# Patient Record
Sex: Male | Born: 1990 | Race: Black or African American | Hispanic: No | Marital: Married | Smoking: Never smoker
Health system: Southern US, Community
[De-identification: ages and names within clinical notes are randomized; demographics above are authoritative.]

## PROBLEM LIST (undated history)

## (undated) DIAGNOSIS — J302 Other seasonal allergic rhinitis: Secondary | ICD-10-CM

## (undated) DIAGNOSIS — B019 Varicella without complication: Secondary | ICD-10-CM

## (undated) DIAGNOSIS — I10 Essential (primary) hypertension: Secondary | ICD-10-CM

## (undated) HISTORY — DX: Varicella without complication: B01.9

## (undated) HISTORY — DX: Other seasonal allergic rhinitis: J30.2

## (undated) HISTORY — DX: Essential (primary) hypertension: I10

---
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
## 2011-02-07 ENCOUNTER — Emergency Department (HOSPITAL_COMMUNITY): Payer: Self-pay

## 2011-02-07 ENCOUNTER — Emergency Department (HOSPITAL_COMMUNITY)
Admission: EM | Admit: 2011-02-07 | Discharge: 2011-02-07 | Disposition: A | Discharge status: Home / Self Care | Payer: Self-pay | Attending: Emergency Medicine | Admitting: Emergency Medicine

## 2011-02-07 DIAGNOSIS — R071 Chest pain on breathing: Secondary | ICD-10-CM | POA: Insufficient documentation

## 2011-02-07 DIAGNOSIS — W03XXXA Other fall on same level due to collision with another person, initial encounter: Secondary | ICD-10-CM | POA: Insufficient documentation

## 2011-02-07 DIAGNOSIS — IMO0002 Reserved for concepts with insufficient information to code with codable children: Secondary | ICD-10-CM | POA: Insufficient documentation

## 2012-11-13 ENCOUNTER — Emergency Department (HOSPITAL_COMMUNITY)
Admission: EM | Admit: 2012-11-13 | Discharge: 2012-11-13 | Disposition: A | Discharge status: Home / Self Care | Payer: Self-pay | Attending: Emergency Medicine | Admitting: Emergency Medicine

## 2012-11-13 ENCOUNTER — Encounter (HOSPITAL_COMMUNITY): Payer: Self-pay | Admitting: Nurse Practitioner

## 2012-11-13 ENCOUNTER — Emergency Department (HOSPITAL_COMMUNITY): Payer: Self-pay

## 2012-11-13 DIAGNOSIS — S62319A Displaced fracture of base of unspecified metacarpal bone, initial encounter for closed fracture: Secondary | ICD-10-CM | POA: Insufficient documentation

## 2012-11-13 DIAGNOSIS — IMO0002 Reserved for concepts with insufficient information to code with codable children: Secondary | ICD-10-CM | POA: Insufficient documentation

## 2012-11-13 MED ORDER — HYDROCODONE-ACETAMINOPHEN 5-325 MG PO TABS: 1.0000 | ORAL_TABLET | ORAL | Status: DC | PRN

## 2012-11-13 NOTE — Progress Notes (Signed)
Orthopedic Tech Progress Note Patient Details:  Dylan Herrera [birth year] [id number]  Ortho Devices Type of Ortho Device: Volar splint Ortho Device/Splint Location: right hand;dorsal and volar Ortho Device/Splint Interventions: Application   Lyberti Thrush 11/13/2012, 2:44 PM
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
## 2012-11-13 NOTE — ED Notes (Signed)
Ortho tech advised of pt need.
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
## 2012-11-13 NOTE — ED Provider Notes (Signed)
Medical screening examination/treatment/procedure(s) were performed by non-physician practitioner and as supervising physician I was immediately available for consultation/collaboration.    Rodd Heft R Cashae Weich, MD 11/13/12 1612 

## 2015-01-25 ENCOUNTER — Encounter: Payer: Self-pay | Admitting: Family Medicine

## 2015-01-25 ENCOUNTER — Ambulatory Visit (INDEPENDENT_AMBULATORY_CARE_PROVIDER_SITE_OTHER): Payer: BLUE CROSS/BLUE SHIELD | Admitting: Family Medicine

## 2015-01-25 VITALS — BP 120/80 | Temp 98.5°F | Ht 70.75 in | Wt 240.7 lb

## 2015-01-25 DIAGNOSIS — Z113 Encounter for screening for infections with a predominantly sexual mode of transmission: Secondary | ICD-10-CM | POA: Diagnosis not present

## 2015-01-25 DIAGNOSIS — Z23 Encounter for immunization: Secondary | ICD-10-CM | POA: Diagnosis not present

## 2015-01-25 DIAGNOSIS — Z6833 Body mass index (BMI) 33.0-33.9, adult: Secondary | ICD-10-CM

## 2015-01-25 DIAGNOSIS — Z7189 Other specified counseling: Secondary | ICD-10-CM | POA: Diagnosis not present

## 2015-01-25 DIAGNOSIS — Z0000 Encounter for general adult medical examination without abnormal findings: Secondary | ICD-10-CM | POA: Diagnosis not present

## 2015-01-25 DIAGNOSIS — Z7689 Persons encountering health services in other specified circumstances: Secondary | ICD-10-CM
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
## 2015-01-25 NOTE — Patient Instructions (Signed)
BEFORE YOU LEAVE: -Tdap -labs  -We have ordered labs or studies at this visit. It can take up to 1-2 weeks for results and processing. We will contact you with instructions IF your results are abnormal. Normal results will be released to your [facility]. If you have not heard from us or can not find your results in [facility] in 2 weeks please contact our office.  We recommend the following healthy lifestyle measures: - eat a healthy diet consisting of lots of vegetables, fruits, beans, nuts, seeds, healthy meats such as white chicken and fish and whole grains.  - avoid fried foods, fast food, processed foods, sodas, red meet and other fattening foods.  - get a least 150 minutes of aerobic exercise per week.

## 2015-01-25 NOTE — Addendum Note (Signed)
Addended by: Raj JanusADKINS, Glen Blatchley T on: 01/25/2015 05:22 PM   Modules accepted: Orders

## 2015-01-26 LAB — GC/CHLAMYDIA PROBE AMP, URINE
Chlamydia, Swab/Urine, PCR: POSITIVE — AB
GC Probe Amp, Urine: NEGATIVE

## 2015-01-26 LAB — LIPID PANEL
CHOL/HDL RATIO: 5
Cholesterol: 189 mg/dL (ref 0–200)
HDL: 35.2 mg/dL — ABNORMAL LOW (ref >39.00)
LDL Cholesterol: 126 mg/dL — ABNORMAL HIGH (ref 0–99)
NONHDL: 153.8
TRIGLYCERIDES: 141 mg/dL (ref 0.0–149.0)
VLDL: 28.2 mg/dL (ref 0.0–40.0)

## 2015-01-26 LAB — HEMOGLOBIN A1C: Hgb A1c MFr Bld: 5.6 % (ref 4.6–6.5)

## 2015-02-02 ENCOUNTER — Encounter: Payer: Self-pay | Admitting: *Deleted

## 2015-02-06 MED ORDER — AZITHROMYCIN 500 MG PO TABS: ORAL_TABLET | ORAL | Status: DC

## 2015-02-06 NOTE — Addendum Note (Signed)
Addended by: Johnella MoloneyFUNDERBURK, JO A on: 02/06/2015 02:38 PM   Modules accepted: Orders

## 2017-05-21 ENCOUNTER — Encounter: Payer: Self-pay | Admitting: Family Medicine

## 2017-09-10 ENCOUNTER — Encounter: Payer: Self-pay | Admitting: Family Medicine

## 2017-09-20 ENCOUNTER — Emergency Department (HOSPITAL_COMMUNITY): Payer: Medicaid Other | Admitting: Certified Registered Nurse Anesthetist

## 2017-09-20 ENCOUNTER — Encounter (HOSPITAL_COMMUNITY): Admission: EM | Disposition: A | Discharge status: Home / Self Care | Payer: Self-pay | Source: Home / Self Care

## 2017-09-20 ENCOUNTER — Emergency Department (HOSPITAL_COMMUNITY): Payer: Medicaid Other

## 2017-09-20 ENCOUNTER — Inpatient Hospital Stay (HOSPITAL_COMMUNITY)
Admission: EM | Admit: 2017-09-20 | Discharge: 2017-09-21 | DRG: 343 | Disposition: A | Discharge status: Home / Self Care | Payer: Medicaid Other | Attending: Surgery | Admitting: Surgery

## 2017-09-20 ENCOUNTER — Encounter (HOSPITAL_COMMUNITY): Payer: Self-pay

## 2017-09-20 ENCOUNTER — Other Ambulatory Visit: Payer: Self-pay

## 2017-09-20 DIAGNOSIS — K3580 Unspecified acute appendicitis: Secondary | ICD-10-CM | POA: Diagnosis present

## 2017-09-20 HISTORY — PX: LAPAROSCOPIC APPENDECTOMY: SHX408

## 2017-09-20 LAB — COMPREHENSIVE METABOLIC PANEL
ALBUMIN: 4.6 g/dL (ref 3.5–5.0)
ALK PHOS: 47 U/L (ref 38–126)
ALT: 24 U/L (ref 17–63)
ANION GAP: 11 (ref 5–15)
AST: 27 U/L (ref 15–41)
BILIRUBIN TOTAL: 1.6 mg/dL — AB (ref 0.3–1.2)
BUN: 15 mg/dL (ref 6–20)
CALCIUM: 9.5 mg/dL (ref 8.9–10.3)
CO2: 23 mmol/L (ref 22–32)
Chloride: 104 mmol/L (ref 101–111)
Creatinine, Ser: 0.97 mg/dL (ref 0.61–1.24)
GFR calc Af Amer: >60 mL/min (ref >60)
GFR calc non Af Amer: >60 mL/min (ref >60)
GLUCOSE: 84 mg/dL (ref 65–99)
POTASSIUM: 4.1 mmol/L (ref 3.5–5.1)
Sodium: 138 mmol/L (ref 135–145)
TOTAL PROTEIN: 7.7 g/dL (ref 6.5–8.1)

## 2017-09-20 LAB — LIPASE, BLOOD: Lipase: 21 U/L (ref 11–51)

## 2017-09-20 LAB — CBC
HEMATOCRIT: 43.1 % (ref 39.0–52.0)
HEMOGLOBIN: 14.5 g/dL (ref 13.0–17.0)
MCH: 27.2 pg (ref 26.0–34.0)
MCHC: 33.6 g/dL (ref 30.0–36.0)
MCV: 80.7 fL (ref 78.0–100.0)
Platelets: 256 10*3/uL (ref 150–400)
RBC: 5.34 MIL/uL (ref 4.22–5.81)
RDW: 13.8 % (ref 11.5–15.5)
WBC: 7.1 10*3/uL (ref 4.0–10.5)

## 2017-09-20 SURGERY — APPENDECTOMY, LAPAROSCOPIC
Anesthesia: General | Site: Abdomen

## 2017-09-20 MED ORDER — PROPOFOL 10 MG/ML IV BOLUS
INTRAVENOUS | Status: AC
Start: 2017-09-20 — End: 2017-09-20
  Filled 2017-09-20: qty 20

## 2017-09-20 MED ORDER — HEPARIN SODIUM (PORCINE) 5000 UNIT/ML IJ SOLN
5000.0000 [IU] | Freq: Three times a day (TID) | INTRAMUSCULAR | Status: DC
  Administered 2017-09-20 – 2017-09-21 (×3): 5000 [IU] via SUBCUTANEOUS
  Filled 2017-09-20 (×3): qty 1

## 2017-09-20 MED ORDER — MORPHINE SULFATE (PF) 4 MG/ML IV SOLN
4.0000 mg | Freq: Once | INTRAVENOUS | Status: DC
  Filled 2017-09-20: qty 1

## 2017-09-20 MED ORDER — MIDAZOLAM HCL 2 MG/2ML IJ SOLN
INTRAMUSCULAR | Status: DC | PRN
  Administered 2017-09-20: 2 mg via INTRAVENOUS

## 2017-09-20 MED ORDER — ONDANSETRON HCL 4 MG/2ML IJ SOLN: 4.0000 mg | Freq: Once | INTRAMUSCULAR | Status: DC | PRN

## 2017-09-20 MED ORDER — SUGAMMADEX SODIUM 200 MG/2ML IV SOLN
INTRAVENOUS | Status: DC | PRN
  Administered 2017-09-20: 200 mg via INTRAVENOUS

## 2017-09-20 MED ORDER — IOPAMIDOL (ISOVUE-300) INJECTION 61%
INTRAVENOUS | Status: AC
  Administered 2017-09-20: 100 mL
  Filled 2017-09-20: qty 100

## 2017-09-20 MED ORDER — BUPIVACAINE-EPINEPHRINE (PF) 0.5% -1:200000 IJ SOLN
INTRAMUSCULAR | Status: AC
  Filled 2017-09-20: qty 30

## 2017-09-20 MED ORDER — IBUPROFEN 600 MG PO TABS: 600.0000 mg | ORAL_TABLET | Freq: Four times a day (QID) | ORAL | Status: DC | PRN

## 2017-09-20 MED ORDER — LACTATED RINGERS IV SOLN
INTRAVENOUS | Status: DC | PRN
  Administered 2017-09-20: 17:00:00 via INTRAVENOUS

## 2017-09-20 MED ORDER — 0.9 % SODIUM CHLORIDE (POUR BTL) OPTIME
TOPICAL | Status: DC | PRN
  Administered 2017-09-20: 1000 mL

## 2017-09-20 MED ORDER — PROPOFOL 10 MG/ML IV BOLUS
INTRAVENOUS | Status: DC | PRN
  Administered 2017-09-20: 200 mg via INTRAVENOUS

## 2017-09-20 MED ORDER — BUPIVACAINE-EPINEPHRINE 0.5% -1:200000 IJ SOLN
INTRAMUSCULAR | Status: DC | PRN
  Administered 2017-09-20: 20 mL

## 2017-09-20 MED ORDER — SODIUM CHLORIDE 0.9 % IR SOLN
Status: DC | PRN
  Administered 2017-09-20: 1000 mL

## 2017-09-20 MED ORDER — OXYCODONE HCL 5 MG/5ML PO SOLN: 5.0000 mg | Freq: Once | ORAL | Status: DC | PRN

## 2017-09-20 MED ORDER — METRONIDAZOLE IN NACL 5-0.79 MG/ML-% IV SOLN
500.0000 mg | Freq: Once | INTRAVENOUS | Status: AC
  Administered 2017-09-20: 500 mg via INTRAVENOUS
  Filled 2017-09-20: qty 100

## 2017-09-20 MED ORDER — HYDROMORPHONE HCL 1 MG/ML IJ SOLN
0.5000 mg | INTRAMUSCULAR | Status: DC | PRN
  Administered 2017-09-20: 0.5 mg via INTRAVENOUS
  Filled 2017-09-20: qty 1

## 2017-09-20 MED ORDER — TRAMADOL HCL 50 MG PO TABS
50.0000 mg | ORAL_TABLET | Freq: Four times a day (QID) | ORAL | Status: DC | PRN
  Administered 2017-09-20 – 2017-09-21 (×2): 50 mg via ORAL
  Filled 2017-09-20 (×2): qty 1

## 2017-09-20 MED ORDER — HYDRALAZINE HCL 20 MG/ML IJ SOLN: 10.0000 mg | INTRAMUSCULAR | Status: DC | PRN

## 2017-09-20 MED ORDER — FENTANYL CITRATE (PF) 100 MCG/2ML IJ SOLN
25.0000 ug | INTRAMUSCULAR | Status: DC | PRN
  Administered 2017-09-20 (×2): 50 ug via INTRAVENOUS

## 2017-09-20 MED ORDER — OXYCODONE HCL 5 MG PO TABS: 5.0000 mg | ORAL_TABLET | Freq: Once | ORAL | Status: DC | PRN

## 2017-09-20 MED ORDER — ONDANSETRON 4 MG PO TBDP: 4.0000 mg | ORAL_TABLET | Freq: Four times a day (QID) | ORAL | Status: DC | PRN

## 2017-09-20 MED ORDER — FENTANYL CITRATE (PF) 250 MCG/5ML IJ SOLN
INTRAMUSCULAR | Status: DC | PRN
  Administered 2017-09-20: 50 ug via INTRAVENOUS
  Administered 2017-09-20: 100 ug via INTRAVENOUS
  Administered 2017-09-20 (×2): 50 ug via INTRAVENOUS

## 2017-09-20 MED ORDER — SIMETHICONE 80 MG PO CHEW: 40.0000 mg | CHEWABLE_TABLET | Freq: Four times a day (QID) | ORAL | Status: DC | PRN

## 2017-09-20 MED ORDER — SODIUM CHLORIDE 0.9 % IV BOLUS (SEPSIS)
1000.0000 mL | Freq: Once | INTRAVENOUS | Status: AC
  Administered 2017-09-20: 1000 mL via INTRAVENOUS

## 2017-09-20 MED ORDER — FENTANYL CITRATE (PF) 100 MCG/2ML IJ SOLN
INTRAMUSCULAR | Status: AC
  Filled 2017-09-20: qty 2

## 2017-09-20 MED ORDER — MIDAZOLAM HCL 2 MG/2ML IJ SOLN
INTRAMUSCULAR | Status: AC
  Filled 2017-09-20: qty 2

## 2017-09-20 MED ORDER — SODIUM CHLORIDE 0.9 % IJ SOLN
INTRAMUSCULAR | Status: DC | PRN
  Administered 2017-09-20: 10 mL

## 2017-09-20 MED ORDER — FENTANYL CITRATE (PF) 250 MCG/5ML IJ SOLN
INTRAMUSCULAR | Status: AC
  Filled 2017-09-20: qty 5

## 2017-09-20 MED ORDER — DIPHENHYDRAMINE HCL 25 MG PO CAPS: 25.0000 mg | ORAL_CAPSULE | Freq: Four times a day (QID) | ORAL | Status: DC | PRN

## 2017-09-20 MED ORDER — ROCURONIUM BROMIDE 10 MG/ML (PF) SYRINGE
PREFILLED_SYRINGE | INTRAVENOUS | Status: DC | PRN
  Administered 2017-09-20: 50 mg via INTRAVENOUS

## 2017-09-20 MED ORDER — ACETAMINOPHEN 500 MG PO TABS
1000.0000 mg | ORAL_TABLET | Freq: Four times a day (QID) | ORAL | Status: DC
  Administered 2017-09-20 – 2017-09-21 (×2): 1000 mg via ORAL
  Filled 2017-09-20 (×2): qty 2

## 2017-09-20 MED ORDER — ONDANSETRON HCL 4 MG/2ML IJ SOLN
4.0000 mg | Freq: Four times a day (QID) | INTRAMUSCULAR | Status: DC | PRN
  Administered 2017-09-20: 4 mg via INTRAVENOUS
  Filled 2017-09-20: qty 2

## 2017-09-20 MED ORDER — LACTATED RINGERS IV SOLN
INTRAVENOUS | Status: DC
  Administered 2017-09-20 – 2017-09-21 (×2): via INTRAVENOUS

## 2017-09-20 MED ORDER — ONDANSETRON HCL 4 MG/2ML IJ SOLN
INTRAMUSCULAR | Status: DC | PRN
  Administered 2017-09-20: 4 mg via INTRAVENOUS

## 2017-09-20 MED ORDER — DEXTROSE 5 % IV SOLN
2.0000 g | Freq: Once | INTRAVENOUS | Status: AC
  Administered 2017-09-20: 2 g via INTRAVENOUS
  Filled 2017-09-20: qty 2

## 2017-09-20 SURGICAL SUPPLY — 43 items
ADH SKN CLS APL DERMABOND .7 (GAUZE/BANDAGES/DRESSINGS) ×1
APPLIER CLIP 5 13 M/L LIGAMAX5 (MISCELLANEOUS)
APR CLP MED LRG 5 ANG JAW (MISCELLANEOUS)
BAG SPEC RTRVL LRG 6X4 10 (ENDOMECHANICALS) ×1
BLADE CLIPPER SURG (BLADE) ×2 IMPLANT
CANISTER SUCT 3000ML PPV (MISCELLANEOUS) ×3 IMPLANT
CHLORAPREP W/TINT 26ML (MISCELLANEOUS) ×3 IMPLANT
CLIP APPLIE 5 13 M/L LIGAMAX5 (MISCELLANEOUS) IMPLANT
COVER SURGICAL LIGHT HANDLE (MISCELLANEOUS) ×3 IMPLANT
CUTTER FLEX LINEAR 45M (STAPLE) ×3 IMPLANT
DERMABOND ADVANCED (GAUZE/BANDAGES/DRESSINGS) ×2
DERMABOND ADVANCED .7 DNX12 (GAUZE/BANDAGES/DRESSINGS) ×1 IMPLANT
ELECT REM PT RETURN 9FT ADLT (ELECTROSURGICAL) ×3
ELECTRODE REM PT RTRN 9FT ADLT (ELECTROSURGICAL) ×1 IMPLANT
GLOVE BIO SURGEON STRL SZ7.5 (GLOVE) ×3 IMPLANT
GLOVE INDICATOR 8.0 STRL GRN (GLOVE) ×3 IMPLANT
GOWN STRL REUS W/ TWL LRG LVL3 (GOWN DISPOSABLE) ×2 IMPLANT
GOWN STRL REUS W/ TWL XL LVL3 (GOWN DISPOSABLE) ×1 IMPLANT
GOWN STRL REUS W/TWL LRG LVL3 (GOWN DISPOSABLE) ×6
GOWN STRL REUS W/TWL XL LVL3 (GOWN DISPOSABLE) ×3
KIT BASIN OR (CUSTOM PROCEDURE TRAY) ×3 IMPLANT
KIT ROOM TURNOVER OR (KITS) ×3 IMPLANT
NS IRRIG 1000ML POUR BTL (IV SOLUTION) ×3 IMPLANT
PAD ARMBOARD 7.5X6 YLW CONV (MISCELLANEOUS) ×6 IMPLANT
POUCH SPECIMEN RETRIEVAL 10MM (ENDOMECHANICALS) ×3 IMPLANT
RELOAD 45 VASCULAR/THIN (ENDOMECHANICALS) ×3 IMPLANT
RELOAD STAPLE 45 2.5 WHT GRN (ENDOMECHANICALS) IMPLANT
RELOAD STAPLE 45 3.5 BLU ETS (ENDOMECHANICALS) IMPLANT
RELOAD STAPLE TA45 3.5 REG BLU (ENDOMECHANICALS) ×3 IMPLANT
SCISSORS LAP 5X35 DISP (ENDOMECHANICALS) ×2 IMPLANT
SET IRRIG TUBING LAPAROSCOPIC (IRRIGATION / IRRIGATOR) ×3 IMPLANT
SHEARS HARMONIC ACE PLUS 36CM (ENDOMECHANICALS) ×2 IMPLANT
SLEEVE ENDOPATH XCEL 5M (ENDOMECHANICALS) ×3 IMPLANT
SPECIMEN JAR SMALL (MISCELLANEOUS) ×3 IMPLANT
SUT MNCRL AB 4-0 PS2 18 (SUTURE) ×3 IMPLANT
TOWEL OR 17X24 6PK STRL BLUE (TOWEL DISPOSABLE) ×1 IMPLANT
TOWEL OR 17X26 10 PK STRL BLUE (TOWEL DISPOSABLE) ×3 IMPLANT
TRAY FOLEY CATH SILVER 16FR (SET/KITS/TRAYS/PACK) ×3 IMPLANT
TRAY LAPAROSCOPIC MC (CUSTOM PROCEDURE TRAY) ×3 IMPLANT
TROCAR XCEL BLUNT TIP 100MML (ENDOMECHANICALS) ×3 IMPLANT
TROCAR XCEL NON-BLD 5MMX100MML (ENDOMECHANICALS) ×3 IMPLANT
TUBING INSUFFLATION (TUBING) ×3 IMPLANT
WATER STERILE IRR 1000ML POUR (IV SOLUTION) ×3 IMPLANT

## 2017-09-20 NOTE — ED Provider Notes (Signed)
Patient placed in Quick Look pathway, seen and evaluated   Chief Complaint: Abdominal pain  HPI:   27 year old male with right lower quadrant abdominal pain starting yesterday sharp in nature worse with ambulation worse with palpation.  He reports some minor nausea, denies any vomiting, denies any fever history of the same.  Remainder of abdomen without complaint.  ROS: Abdominal pain (one)  Physical Exam:   Gen: No distress  Neuro: Awake and Alert  Skin: Warm    Focused Exam: There is palpation of the right lower abdomen, no rebound guarding or peritoneal signs.   Initiation of care has begun. The patient has been counseled on the process, plan, and necessity for staying for the completion/evaluation, and the remainder of the medical screening examination    Rosalio LoudHedges, Cambell Rickenbach, PA-C 09/20/17 1320    Rolland PorterJames, Mark, MD 09/20/17 2147

## 2017-09-20 NOTE — Anesthesia Preprocedure Evaluation (Signed)
Anesthesia Evaluation  Patient identified by MRN, date of birth, ID band Patient awake    Reviewed: Allergy & Precautions, NPO status , Patient's Chart, lab work & pertinent test results  Airway Mallampati: II  TM Distance: >3 FB Neck ROM: Full    Dental  (+) Teeth Intact, Dental Advisory Given   Pulmonary    breath sounds clear to auscultation       Cardiovascular  Rhythm:Regular Rate:Normal     Neuro/Psych    GI/Hepatic   Endo/Other    Renal/GU      Musculoskeletal   Abdominal   Peds  Hematology   Anesthesia Other Findings   Reproductive/Obstetrics                             Anesthesia Physical Anesthesia Plan  ASA: II and emergent  Anesthesia Plan: General   Post-op Pain Management:    Induction: Intravenous, Rapid sequence and Cricoid pressure planned  PONV Risk Score and Plan: Ondansetron and Dexamethasone  Airway Management Planned:   Additional Equipment:   Intra-op Plan:   Post-operative Plan: Extubation in OR  Informed Consent: I have reviewed the patients History and Physical, chart, labs and discussed the procedure including the risks, benefits and alternatives for the proposed anesthesia with the patient or authorized representative who has indicated his/her understanding and acceptance.   Dental advisory given  Plan Discussed with: CRNA and Anesthesiologist  Anesthesia Plan Comments:         Anesthesia Quick Evaluation
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
## 2017-09-20 NOTE — Anesthesia Postprocedure Evaluation (Signed)
Anesthesia Post Note  Patient: Dylan Herrera  Procedure(s) Performed: APPENDECTOMY LAPAROSCOPIC (N/A Abdomen)     Patient location during evaluation: PACU Anesthesia Type: General Level of consciousness: awake and alert Pain management: pain level controlled Vital Signs Assessment: post-procedure vital signs reviewed and stable Respiratory status: spontaneous breathing, nonlabored ventilation, respiratory function stable and patient connected to nasal cannula oxygen Cardiovascular status: blood pressure returned to baseline and stable Postop Assessment: no apparent nausea or vomiting Anesthetic complications: no    Last Vitals:  Vitals:   09/20/17 1940 09/20/17 1952  BP: 125/74 118/63  Pulse: 68 73  Resp: 16 16  Temp: 36.5 C 36.6 C  SpO2: 99% 99%    Last Pain:  Vitals:   09/20/17 2000  TempSrc:   PainSc: 10-Worst pain ever                 Alicianna Litchford COKER

## 2017-09-20 NOTE — ED Notes (Signed)
Report called to [id number], pt went for Sx room 36 in short stay

## 2017-09-20 NOTE — Anesthesia Procedure Notes (Signed)
Procedure Name: Intubation Date/Time: 09/20/2017 5:12 PM Performed by: Clearnce Sorrel, CRNA Pre-anesthesia Checklist: Patient identified, Emergency Drugs available, Suction available, Patient being monitored and Timeout performed Patient Re-evaluated:Patient Re-evaluated prior to induction Oxygen Delivery Method: Circle system utilized Preoxygenation: Pre-oxygenation with 100% oxygen Induction Type: IV induction Ventilation: Mask ventilation without difficulty Laryngoscope Size: Mac and 4 Grade View: Grade I Tube type: Oral Tube size: 7.5 mm Number of attempts: 1 Airway Equipment and Method: Stylet Placement Confirmation: ETT inserted through vocal cords under direct vision,  positive ETCO2 and breath sounds checked- equal and bilateral Secured at: 23 cm Tube secured with: Tape Dental Injury: Teeth and Oropharynx as per pre-operative assessment

## 2017-09-20 NOTE — Op Note (Signed)
Dylan Herrera [id number]   PRE-OPERATIVE DIAGNOSIS:  acute appendicitis  POST-OPERATIVE DIAGNOSIS:  acute appendicitis  Procedure(s): APPENDECTOMY LAPAROSCOPIC  SURGEON:  Stephanie Coup. Jurrell Royster, M.D.  ASSISTANT: None  ANESTHESIA: General endotracheal  EBL:   10cc  DRAINS: None  SPECIMEN:  Appendix  COUNTS:  Sponge, needle and instrument counts were reported correct x2 at conclusion of the operation  DISPOSITION:  PACU in satisfactory condition  FINDINGS: Acute nonperforated appendicitis.   INDICATIONS:  Dylan Herrera is an 27 y.o. male who is here for acute onset RLQ pain - began yesterday in his RLQ; sharp. Made worse by ambulating. Never had this before. Pain does not radiate. Pain is severe. Some nausea, no emesis. Denies fevers/chills.  In the emergency department he was afebrile.  He was quite tender to palpation in the right lower quadrant.  He had a positive Rovsing's sign.  [name] was 7.1.  He underwent CT scan of the abdomen and pelvis which demonstrated acute appendicitis without perforation or abscess-appendix dilated to 15 mm.   The anatomy and physiology of the GI tract was discussed at length. The pathophysiology of appendicitis was elaborated.  The treatment approach to appendicitis including both nonoperative/antibiotic as well as operative approaches were discussed.  Minimally invasive as well as open techniques regarding surgery were discussed. The planned procedure, material risks (including, but not limited to, pain, bleeding, infection, scarring, nerve damage, damage to surrounding structures/viscus/bowel, need for additional procedures, possible need for stoma, hernia, heart attack, stroke, death) benefits and alternatives were discussed. His questions were answered to his satisfaction. He voiced understanding and elected to proceed with surgery  DESCRIPTION:  The patient was identified & brought into the operating room. SCDs were in  place and functioning. General endotracheal anesthesia was administered. Preoperative antibiotics were administered. The patient was positioned supine with left arm tucked. A foley catheter was inserted under sterile conditions. The abdomen was prepped and draped in the standard sterile fashion. A surgical timeout confirmed our plan.  A small infraumbilical incision was made. The subcutaneous tissue was dissected and the umbilical stalk identified. The stalk was grasped with a Kocher and retracted outwardly. The infraumbilical fascia was exposed and incised. Peritoneal entry was carefully made bluntly. An 0 Vicryl purse-string suture was placed and then the [facility] port was introduced into the abdomen.  CO2 insufflation commenced to . The laparoscope was inserted and confirmed no evidence of injury. The patient was then positioned in Trendelenburg. Two additional ports were placed - one in left lower quadrant and another in the suprapubic midline staying well above the bladder. The patient was then placed in the left side down position.  The terminal ileum was gently mobilized to proximal ascending colon in a lateral to medial fashion. Care was taken to avoid injuring any retroperitoneal structures. The attachments to the appendix from the ascending colon and cecal mesentery were freed.  The appendix was elevated.  The base of the appendix was circumferentially dissected. The base was noted to be viable and healthy appearing.  The appendix was then stapled with a blue load, taking a small healthy 2mm cuff of viable cecum. A single spot of oozing on the staple line was controlled with a 5mm clip. The mesoappendix was then ligated using a Lynnox Girten load of the stapler - the last part of the mesoappendix was taken with the harmonic scalpel. The appendix was placed in an EndoBag and extracted through the umbilical port site.  The right lower  quadrant was irrigated. Hemostasis was noted to be achieved - taking  time to inspect the ligated mesoappendix, colon mesentery, and retroperitoneum. Staple line was noted to be intact on the cecum with no bleeding. There was no perforation or injury. The right lower quadrant appeared clean and as such, no drain was placed.  The left lower quadrant and suprapubic ports were removed under direct visualization. The CO2 was exhausted from the abdomen. The umbilical fascia was then closed using 0 Vicryl suture. The skin of all port sites was then approximated using 4-0 Monocryl suture. The incisions were dressed with Dermabond.  The patient was then extubated and transferred to a stretcher for transport to recovery in satisfactory condition.

## 2017-09-20 NOTE — Transfer of Care (Signed)
Immediate Anesthesia Transfer of Care Note  Patient: Dylan Herrera  Procedure(s) Performed: APPENDECTOMY LAPAROSCOPIC (N/A Abdomen)  Patient Location: PACU  Anesthesia Type:General  Level of Consciousness: awake and drowsy  Airway & Oxygen Therapy: Patient Spontanous Breathing and Patient connected to nasal cannula oxygen  Post-op Assessment: Report given to RN and Post -op Vital signs reviewed and stable  Post vital signs: Reviewed and stable  Last Vitals:  Vitals:   09/20/17 1314 09/20/17 1830  BP: (!) 146/91 137/88  Pulse: 74 87  Resp: 16 14  Temp: 37.2 C 36.7 C  SpO2: 100% 96%    Last Pain:  Vitals:   09/20/17 1830  TempSrc:   PainSc: Asleep         Complications: No apparent anesthesia complications

## 2017-09-20 NOTE — ED Triage Notes (Signed)
Pt presents for evaluation of RLQ pain starting yesterday. Pt reports initially felt like stomach cramps/indigestion. Reports feeling bloated. Pt reports mild nausea and denies diarrhea.

## 2017-09-20 NOTE — H&P (Signed)
CC: RLQ abdominal pain; appendicitis - consult by PA-C Okey Regal  HPI: Dylan Herrera is an 27 y.o. male who is here for acute onset RLQ pain - began yesterday in his RLQ; sharp. Made worse by ambulating. Never had this before. Pain does not radiate. Pain is severe. Some nausea, no emesis. Denies fevers/chills.  His wife, daughter and mother were present  Past Medical History:  Diagnosis Date  . Chicken pox   . Seasonal allergies     Past Surgical History:  Procedure Laterality Date  . ELBOW SURGERY  2008   foot ball injury, fx    No family history on file.  Social: +Cigarettes - a few per week; no EtOH, denies drug use  Allergies: No Known Allergies  Medications: I have reviewed the patient's current medications.  Results for orders placed or performed during the hospital encounter of 09/20/17 (from the past 48 hour(s))  Lipase, blood     Status: None   Collection Time: 09/20/17  1:16 PM  Result Value Ref Range   Lipase 21 11 - 51 U/L  Comprehensive metabolic panel     Status: Abnormal   Collection Time: 09/20/17  1:16 PM  Result Value Ref Range   Sodium 138 135 - 145 mmol/L   Potassium 4.1 3.5 - 5.1 mmol/L   Chloride 104 101 - 111 mmol/L   CO2 23 22 - 32 mmol/L   Glucose, Bld 84 65 - 99 mg/dL   BUN 15 6 - 20 mg/dL   Creatinine, Ser 0.97 0.61 - 1.24 mg/dL   Calcium 9.5 8.9 - 10.3 mg/dL   Total Protein 7.7 6.5 - 8.1 g/dL   Albumin 4.6 3.5 - 5.0 g/dL   AST 27 15 - 41 U/L   ALT 24 17 - 63 U/L   Alkaline Phosphatase 47 38 - 126 U/L   Total Bilirubin 1.6 (H) 0.3 - 1.2 mg/dL   GFR calc non Af Amer >60 >60 mL/min   GFR calc Af Amer >60 >60 mL/min    Comment: (NOTE) The eGFR has been calculated using the CKD EPI equation. This calculation has not been validated in all clinical situations. eGFR's persistently <60 mL/min signify possible Chronic Kidney Disease.    Anion gap 11 5 - 15  CBC     Status: None   Collection Time: 09/20/17  1:16 PM  Result Value  Ref Range   WBC 7.1 4.0 - 10.5 K/uL   RBC 5.34 4.22 - 5.81 MIL/uL   Hemoglobin 14.5 13.0 - 17.0 g/dL   HCT 43.1 39.0 - 52.0 %   MCV 80.7 78.0 - 100.0 fL   MCH 27.2 26.0 - 34.0 pg   MCHC 33.6 30.0 - 36.0 g/dL   RDW 13.8 11.5 - 15.5 %   Platelets 256 150 - 400 K/uL    Ct Abdomen Pelvis W Contrast  Addendum Date: 09/20/2017   ADDENDUM REPORT: 09/20/2017 15:38 ADDENDUM: Findings conveyed toJordan Elesa Hacker 09/20/2017  at15:37. Electronically Signed   By: Suzy Bouchard M.D.   On: 09/20/2017 15:38   Result Date: 09/20/2017 CLINICAL DATA:  Abdominal pain.  Nausea and diarrhea EXAM: CT ABDOMEN AND PELVIS WITH CONTRAST TECHNIQUE: Multidetector CT imaging of the abdomen and pelvis was performed using the standard protocol following bolus administration of intravenous contrast. CONTRAST:  179m ISOVUE-300 IOPAMIDOL (ISOVUE-300) INJECTION 61% COMPARISON:  None. FINDINGS: Lower chest: Lung bases are clear. Hepatobiliary: No focal hepatic lesion. No biliary duct dilatation. Gallbladder is normal. Common bile duct  is normal. Pancreas: Pancreas is normal. No ductal dilatation. No pancreatic inflammation. Spleen: Normal spleen Adrenals/urinary tract: Adrenal glands and kidneys are normal. The ureters and bladder normal. Stomach/Bowel: Stomach, small bowel cecum normal. The appendix extends inferior medially from the cecal tip and is dilated to 14 mm. There is hyperemia of the appendix wall. There is mild amount of periappendiceal fat inflammation. No perforation or abscess. Findings consistent acute appendicitis. Remaining of the colon is normal. Appendix: Location: Typical location inferior medial to the cecal tip in the RIGHT lower quadrant Diameter: 15 mm Appendicolith: Absent Mucosal hyper-enhancement: Present Extraluminal gas: Absent Periappendiceal collection: Abscess Vascular/Lymphatic: Abdominal aorta is normal caliber. There is no retroperitoneal or periportal lymphadenopathy. No pelvic  lymphadenopathy. Reproductive: Prostate normal Other: No free fluid. Musculoskeletal: No aggressive osseous lesion. IMPRESSION: 1. Acute appendicitis without perforation or abscess. 2. Appendix: Location: Typical location inferior medial to the cecum Diameter: 15 mm Appendicolith: Absent Mucosal hyper-enhancement: Present Extraluminal gas: Absent Periappendiceal collection: Absent Findings conveyed toJEFFREY HEDGES on 09/20/2017  at15:35. Electronically Signed: By: Suzy Bouchard M.D. On: 09/20/2017 15:33    ROS - all of the below systems have been reviewed with the patient and positives are indicated with bold text General: chills, fever or night sweats Eyes: blurry vision or double vision ENT: epistaxis or sore throat Allergy/Immunology: itchy/watery eyes or nasal congestion Hematologic/Lymphatic: bleeding problems, blood clots or swollen lymph nodes Endocrine: temperature intolerance or unexpected weight changes Breast: new or changing breast lumps or nipple discharge Resp: cough, shortness of breath, or wheezing CV: chest pain or dyspnea on exertion GI: as per HPI GU: dysuria, trouble voiding, or hematuria MSK: joint pain or joint stiffness Neuro: TIA or stroke symptoms Derm: pruritus and skin lesion changes Psych: anxiety and depression  PE Blood pressure (!) 146/91, pulse 74, temperature 98.9 F (37.2 C), temperature source Oral, resp. rate 16, SpO2 100 %. Constitutional: NAD; conversant; no deformities Eyes: Moist conjunctiva; no lid lag; anicteric; PERRL Neck: Trachea midline; no thyromegaly Lungs: Normal respiratory effort; no tactile fremitus CV: RRR; no palpable thrills; no pitting edema GI: Abd soft, tender to palpation in RLQ; +Rovsing's; no rebound/guarding; no palpable hepatosplenomegaly MSK: Normal gait; no clubbing/cyanosis Psychiatric: Appropriate affect; alert and oriented x3 Lymphatic: No palpable cervical or axillary lymphadenopathy  Results for orders placed or  performed during the hospital encounter of 09/20/17 (from the past 48 hour(s))  Lipase, blood     Status: None   Collection Time: 09/20/17  1:16 PM  Result Value Ref Range   Lipase 21 11 - 51 U/L  Comprehensive metabolic panel     Status: Abnormal   Collection Time: 09/20/17  1:16 PM  Result Value Ref Range   Sodium 138 135 - 145 mmol/L   Potassium 4.1 3.5 - 5.1 mmol/L   Chloride 104 101 - 111 mmol/L   CO2 23 22 - 32 mmol/L   Glucose, Bld 84 65 - 99 mg/dL   BUN 15 6 - 20 mg/dL   Creatinine, Ser 0.97 0.61 - 1.24 mg/dL   Calcium 9.5 8.9 - 10.3 mg/dL   Total Protein 7.7 6.5 - 8.1 g/dL   Albumin 4.6 3.5 - 5.0 g/dL   AST 27 15 - 41 U/L   ALT 24 17 - 63 U/L   Alkaline Phosphatase 47 38 - 126 U/L   Total Bilirubin 1.6 (H) 0.3 - 1.2 mg/dL   GFR calc non Af Amer >60 >60 mL/min   GFR calc Af Amer >60 >60 mL/min  Comment: (NOTE) The eGFR has been calculated using the CKD EPI equation. This calculation has not been validated in all clinical situations. eGFR's persistently <60 mL/min signify possible Chronic Kidney Disease.    Anion gap 11 5 - 15  CBC     Status: None   Collection Time: 09/20/17  1:16 PM  Result Value Ref Range   WBC 7.1 4.0 - 10.5 K/uL   RBC 5.34 4.22 - 5.81 MIL/uL   Hemoglobin 14.5 13.0 - 17.0 g/dL   HCT 43.1 39.0 - 52.0 %   MCV 80.7 78.0 - 100.0 fL   MCH 27.2 26.0 - 34.0 pg   MCHC 33.6 30.0 - 36.0 g/dL   RDW 13.8 11.5 - 15.5 %   Platelets 256 150 - 400 K/uL    Ct Abdomen Pelvis W Contrast  Addendum Date: 09/20/2017   ADDENDUM REPORT: 09/20/2017 15:38 ADDENDUM: Findings conveyed toJordan Elesa Hacker 09/20/2017  at15:37. Electronically Signed   By: Suzy Bouchard M.D.   On: 09/20/2017 15:38   Result Date: 09/20/2017 CLINICAL DATA:  Abdominal pain.  Nausea and diarrhea EXAM: CT ABDOMEN AND PELVIS WITH CONTRAST TECHNIQUE: Multidetector CT imaging of the abdomen and pelvis was performed using the standard protocol following bolus administration of  intravenous contrast. CONTRAST:  132m ISOVUE-300 IOPAMIDOL (ISOVUE-300) INJECTION 61% COMPARISON:  None. FINDINGS: Lower chest: Lung bases are clear. Hepatobiliary: No focal hepatic lesion. No biliary duct dilatation. Gallbladder is normal. Common bile duct is normal. Pancreas: Pancreas is normal. No ductal dilatation. No pancreatic inflammation. Spleen: Normal spleen Adrenals/urinary tract: Adrenal glands and kidneys are normal. The ureters and bladder normal. Stomach/Bowel: Stomach, small bowel cecum normal. The appendix extends inferior medially from the cecal tip and is dilated to 14 mm. There is hyperemia of the appendix wall. There is mild amount of periappendiceal fat inflammation. No perforation or abscess. Findings consistent acute appendicitis. Remaining of the colon is normal. Appendix: Location: Typical location inferior medial to the cecal tip in the RIGHT lower quadrant Diameter: 15 mm Appendicolith: Absent Mucosal hyper-enhancement: Present Extraluminal gas: Absent Periappendiceal collection: Abscess Vascular/Lymphatic: Abdominal aorta is normal caliber. There is no retroperitoneal or periportal lymphadenopathy. No pelvic lymphadenopathy. Reproductive: Prostate normal Other: No free fluid. Musculoskeletal: No aggressive osseous lesion. IMPRESSION: 1. Acute appendicitis without perforation or abscess. 2. Appendix: Location: Typical location inferior medial to the cecum Diameter: 15 mm Appendicolith: Absent Mucosal hyper-enhancement: Present Extraluminal gas: Absent Periappendiceal collection: Absent Findings conveyed toJEFFREY HEDGES on 09/20/2017  at15:35. Electronically Signed: By: SSuzy BouchardM.D. On: 09/20/2017 15:33    A/P: DAXTON CIHLARis an 27y.o. male with acute appendicitis -Plan OR for laparoscopic vs open appendectomy; all other indicated procedures -The anatomy and physiology of the GI tract was discussed at length. The pathophysiology of appendicitis was elaborated.  The  treatment approach to appendicitis including both nonoperative/antibiotic as well as operative approaches were discussed.  Minimally invasive as well as open techniques regarding surgery were discussed. The planned procedure, material risks (including, but not limited to, pain, bleeding, infection, scarring, nerve damage, damage to surrounding structures/viscus/bowel, need for additional procedures, possible need for stoma, hernia, heart attack, stroke, death) benefits and alternatives were discussed. His questions were answered to his satisfaction. He voiced understanding and elected to proceed with surgery  CSharon Mt WDema Severin M.D. [facility]

## 2017-09-21 ENCOUNTER — Encounter (HOSPITAL_COMMUNITY): Payer: Self-pay | Admitting: *Deleted

## 2017-09-21 LAB — BASIC METABOLIC PANEL
ANION GAP: 10 (ref 5–15)
BUN: 9 mg/dL (ref 6–20)
CALCIUM: 9.1 mg/dL (ref 8.9–10.3)
CO2: 27 mmol/L (ref 22–32)
CREATININE: 1.06 mg/dL (ref 0.61–1.24)
Chloride: 102 mmol/L (ref 101–111)
GLUCOSE: 83 mg/dL (ref 65–99)
Potassium: 4.5 mmol/L (ref 3.5–5.1)
Sodium: 139 mmol/L (ref 135–145)

## 2017-09-21 LAB — CBC
HCT: 40.1 % (ref 39.0–52.0)
Hemoglobin: 13.4 g/dL (ref 13.0–17.0)
MCH: 27.2 pg (ref 26.0–34.0)
MCHC: 33.4 g/dL (ref 30.0–36.0)
MCV: 81.5 fL (ref 78.0–100.0)
PLATELETS: 241 10*3/uL (ref 150–400)
RBC: 4.92 MIL/uL (ref 4.22–5.81)
RDW: 13.6 % (ref 11.5–15.5)
WBC: 7.1 10*3/uL (ref 4.0–10.5)

## 2017-09-21 LAB — MAGNESIUM: Magnesium: 1.9 mg/dL (ref 1.7–2.4)

## 2017-09-21 LAB — PHOSPHORUS: Phosphorus: 3.5 mg/dL (ref 2.5–4.6)

## 2017-09-21 MED ORDER — OXYCODONE HCL 5 MG PO TABS: 5.0000 mg | ORAL_TABLET | Freq: Four times a day (QID) | ORAL | 0 refills | Status: DC | PRN

## 2017-09-21 MED ORDER — ACETAMINOPHEN 325 MG PO TABS: ORAL_TABLET | ORAL | Status: DC

## 2017-09-21 MED ORDER — IBUPROFEN 200 MG PO TABS: ORAL_TABLET | ORAL | Status: DC

## 2017-09-21 MED ORDER — OXYCODONE-ACETAMINOPHEN 5-325 MG PO TABS
1.0000 | ORAL_TABLET | ORAL | Status: DC | PRN
  Administered 2017-09-21: 1 via ORAL
  Filled 2017-09-21: qty 1

## 2017-09-21 NOTE — Progress Notes (Signed)
1 Day Post-Op    CC: Abdominal pain  Subjective: Patient doing well this a.m.  He is pretty sore but otherwise looks fine.  He is tolerating a diet.  Ambulating with normal postoperative soreness.  Objective: Vital signs in last 24 hours: Temp:  [97.6 F (36.4 C)-98.9 F (37.2 C)] 98.1 F (36.7 C) (01/21 0405) Pulse Rate:  [66-87] 68 (01/21 0405) Resp:  [14-20] 18 (01/21 0405) BP: (118-146)/(63-91) 128/75 (01/21 0405) SpO2:  [96 %-100 %] 99 % (01/21 0405)  240 PO 1445 IV 900 urine Afebrile, VSS  Intake/Output from previous day: 01/20 0701 - 01/21 0700 In: 1685 [P.O.:240; I.V.:1445] Out: 910 [Urine:900; Blood:10] Intake/Output this shift: No intake/output data recorded.  General appearance: alert, cooperative and no distress Resp: clear to auscultation bilaterally GI: Soft, sore, port sites all look good.  Tolerating a soft diet.  Lab Results:  Recent Labs    09/20/17 1316 09/21/17 0712  WBC 7.1 7.1  HGB 14.5 13.4  HCT 43.1 40.1  PLT 256 241    BMET Recent Labs    09/20/17 1316 09/21/17 0712  NA 138 139  K 4.1 4.5  CL 104 102  CO2 23 27  GLUCOSE 84 83  BUN 15 9  CREATININE 0.97 1.06  CALCIUM 9.5 9.1   PT/INR No results for input(s): LABPROT, INR in the last 72 hours.  Recent Labs  Lab 09/20/17 1316  AST 27  ALT 24  ALKPHOS 47  BILITOT 1.6*  PROT 7.7  ALBUMIN 4.6     Lipase     Component Value Date/Time   LIPASE 21 09/20/2017 1316     Medications: . acetaminophen  1,000 mg Oral Q6H  . heparin injection (subcutaneous)  5,000 Units Subcutaneous Q8H   . lactated ringers 100 mL/hr at 09/21/17 0441   Anti-infectives (From admission, onward)   Start     Dose/Rate Route Frequency Ordered Stop   09/20/17 1615  cefTRIAXone (ROCEPHIN) 2 g in dextrose 5 % 50 mL IVPB     2 g 100 mL/hr over 30 Minutes Intravenous  Once 09/20/17 1607 09/20/17 1648   09/20/17 1615  metroNIDAZOLE (FLAGYL) IVPB 500 mg     500 mg 100 mL/hr over 60 Minutes  Intravenous  Once 09/20/17 1607 09/20/17 1719      Assessment/Plan  Acute appendicitis Laparoscopic appendectomy 09/20/17, Dr. Marin Olphristopher White  FEN: Regular diet ID: Preop Rocephin and Flagyl DVT: Heparin Foley: None Follow-up: [facility]  Plan: Discharge home today follow-up in the [facility].  Home on ibuprofen, Tylenol and oxycodone.  LOS: 1 day    Elektra Wartman 09/21/2017 [phone]

## 2017-09-21 NOTE — Progress Notes (Signed)
Discharge home. Home discharge instruction given, no question verbalized. 

## 2017-09-21 NOTE — Discharge Instructions (Signed)
Laparoscopic Appendectomy, Adult, Care After °These instructions give you information about caring for yourself after your procedure. Your doctor may also give you more specific instructions. Call your doctor if you have any problems or questions after your procedure. °Follow these instructions at home: °Medicines °· Take over-the-counter and prescription medicines only as told by your doctor. °· Do not drive for 24 hours if you received a sedative. °· Do not drive or use heavy machinery while taking prescription pain medicine. °· If you were prescribed an antibiotic medicine, take it as told by your doctor. Do not stop taking it even if you start to feel better. °Activity °· Do not lift anything that is heavier than 10 pounds (4.5 kg) for 3 weeks or as told by your doctor. °· Do not play contact sports for 3 weeks or as told by your doctor. °· Slowly return to your normal activities. °Bathing °· Keep your cuts from surgery (incisions) clean and dry. °? Gently wash the cuts with soap and water. °? Rinse the cuts with water until the soap is gone. °? Pat the cuts dry with a clean towel. Do not rub the cuts. °· You may take showers after 48 hours. °· Do not take baths, swim, or use a hot tub for 2 weeks or as told by your doctor. °Cut Care °· Follow instructions from your doctor about how to take care of your cuts. Make sure you: °? Wash your hands with soap and water before you change your bandage (dressing). If you do not have soap and water, use hand sanitizer. °? Change your bandage as told by your doctor. °? Leave stitches (sutures), skin glue, or skin tape (adhesive) strips in place. They may need to stay in place for 2 weeks or longer. If tape strips get loose and curl up, you may trim the loose edges. Do not remove tape strips completely unless your doctor says it is okay. °· Check your cuts every day for signs of infection. Check for: °? More redness, swelling, or pain. °? More fluid or  blood. °? Warmth. °? Pus or a bad smell. °Other Instructions °· If you were sent home with a drain, follow instructions from your doctor about how to use it and care for it. °· Take deep breaths. This helps to keep your lungs from getting swollen (inflamed). °· To help with constipation: °? Drink plenty of fluids. °? Eat plenty of fruits and vegetables. °· Keep all follow-up visits as told by your doctor. This is important. °Contact a doctor if: °· You have more redness, swelling, or pain around a cut from surgery. °· You have more fluid or blood coming from a cut. °· Your cut feels warm to the touch. °· You have pus or a bad smell coming from a cut or a bandage. °· The edges of a cut break open after the stitches have been taken out. °· You have pain in your shoulders that gets worse. °· You feel dizzy or you pass out (faint). °· You have shortness of breath. °· You keep feeling sick to your stomach (nauseous). °· You keep throwing up (vomiting). °· You get diarrhea or you cannot control your poop. °· You lose your appetite. °· You have swelling or pain in your legs. °Get help right away if: °· You have a fever. °· You get a rash. °· You have trouble breathing. °· You have sharp pains in your chest. °This information is not intended to replace advice given   to you by your health care provider. Make sure you discuss any questions you have with your health care provider. °Document Released: 06/14/2009 Document Revised: 01/24/2016 Document Reviewed: 02/05/2015 °Elsevier Interactive Patient Education © 2018 Elsevier Inc[employer] ° °CCS ______[facility] °LAPAROSCOPIC SURGERY: POST OP INSTRUCTIONS °Always review your discharge instruction sheet given to you by the facility where your surgery was performed. °IF YOU HAVE DISABILITY OR FAMILY LEAVE FORMS, YOU MUST BRING THEM TO THE OFFICE FOR PROCESSING.   °DO NOT GIVE THEM TO YOUR DOCTOR. ° °1. A prescription for pain medication may be given to you upon  discharge.  Take your pain medication as prescribed, if needed.  If narcotic pain medicine is not needed, then you may take acetaminophen (Tylenol) or ibuprofen (Advil) as needed. °2. Take your usually prescribed medications unless otherwise directed. °3. If you need a refill on your pain medication, please contact your pharmacy.  They will contact our office to request authorization. Prescriptions will not be filled after 5pm or on week-ends. °4. You should follow a light diet the first few days after arrival home, such as soup and crackers, etc.  Be sure to include lots of fluids daily. °5. Most patients will experience some swelling and bruising in the area of the incisions.  Ice packs will help.  Swelling and bruising can take several days to resolve.  °6. It is common to experience some constipation if taking pain medication after surgery.  Increasing fluid intake and taking a stool softener (such as Colace) will usually help or prevent this problem from occurring.  A mild laxative (Milk of Magnesia or Miralax) should be taken according to package instructions if there are no bowel movements after 48 hours. °7. Unless discharge instructions indicate otherwise, you may remove your bandages 24-48 hours after surgery, and you may shower at that time.  You may have steri-strips (small skin tapes) in place directly over the incision.  These strips should be left on the skin for 7-10 days.  If your surgeon used skin glue on the incision, you may shower in 24 hours.  The glue will flake off over the next 2-3 weeks.  Any sutures or staples will be removed at the office during your follow-up visit. °8. ACTIVITIES:  You may resume regular (light) daily activities beginning the next day--such as daily self-care, walking, climbing stairs--gradually increasing activities as tolerated.  You may have sexual intercourse when it is comfortable.  Refrain from any heavy lifting or straining until approved by your doctor. °a. You  may drive when you are no longer taking prescription pain medication, you can comfortably wear a seatbelt, and you can safely maneuver your car and apply brakes. °b. RETURN TO WORK:  __________________________________________________________ °9. You should see your doctor in the office for a follow-up appointment approximately 2-3 weeks after your surgery.  Make sure that you call for this appointment within a day or two after you arrive home to insure a convenient appointment time. °10. OTHER INSTRUCTIONS: __________________________________________________________________________________________________________________________ __________________________________________________________________________________________________________________________ °WHEN TO CALL YOUR DOCTOR: °1. Fever over 101.0 °2. Inability to urinate °3. Continued bleeding from incision. °4. Increased pain, redness, or drainage from the incision. °5. Increasing abdominal pain ° °The clinic staff is available to answer your questions during regular business hours.  Please don’t hesitate to call and ask to speak to one of the nurses for clinical concerns.  If you have a medical emergency, go to the nearest emergency room or call 911.  A surgeon from   [facility] is always on call at the hospital. °[address], [address], [place], [place]  [address] ? [address] [address], [place], [place]   [address] °[phone] ? [phone] ? FAX [phone] °Web site: [email or web address] ° °

## 2017-09-21 NOTE — Plan of Care (Signed)
  Progressing Education: Knowledge of General Education information will improve 09/21/2017 0316 - Progressing by Gearldine BienenstockIrish, Meaghann Choo B, RN Health Behavior/Discharge Planning: Ability to manage health-related needs will improve 09/21/2017 0316 - Progressing by Gearldine BienenstockIrish, Dominion Kathan B, RN Clinical Measurements: Ability to maintain clinical measurements within normal limits will improve 09/21/2017 0316 - Progressing by Gearldine BienenstockIrish, Harlow Basley B, RN Will remain free from infection 09/21/2017 0316 - Progressing by Gearldine BienenstockIrish, Aureliano Oshields B, RN Diagnostic test results will improve 09/21/2017 0316 - Progressing by Gearldine BienenstockIrish, Ysela Hettinger B, RN Respiratory complications will improve 09/21/2017 0316 - Progressing by Gearldine BienenstockIrish, Jhoel Stieg B, RN Cardiovascular complication will be avoided 09/21/2017 0316 - Progressing by Gearldine BienenstockIrish, Lasean Gorniak B, RN Activity: Risk for activity intolerance will decrease 09/21/2017 0316 - Progressing by Gearldine BienenstockIrish, Harlan Vinal B, RN Nutrition: Adequate nutrition will be maintained 09/21/2017 0316 - Progressing by Gearldine BienenstockIrish, Sarina Robleto B, RN Coping: Level of anxiety will decrease 09/21/2017 0316 - Progressing by Gearldine BienenstockIrish, Aleea Hendry B, RN Elimination: Will not experience complications related to bowel motility 09/21/2017 0316 - Progressing by Gearldine BienenstockIrish, Ceara Wrightson B, RN Will not experience complications related to urinary retention 09/21/2017 0316 - Progressing by Gearldine BienenstockIrish, Jalan Fariss B, RN Pain Managment: General experience of comfort will improve 09/21/2017 0316 - Progressing by Gearldine BienenstockIrish, Kalib Bhagat B, RN Safety: Ability to remain free from injury will improve 09/21/2017 0316 - Progressing by Gearldine BienenstockIrish, Shawanna Zanders B, RN Skin Integrity: Risk for impaired skin integrity will decrease 09/21/2017 0316 - Progressing by Gearldine BienenstockIrish, Jeneal Vogl B, RN Clinical Measurements: Postoperative complications will be avoided or minimized 09/21/2017 0316 - Progressing by Gearldine BienenstockIrish, Mariona Scholes B, RN Skin Integrity: Demonstration of wound healing without infection will improve 09/21/2017 0316 - Progressing by Gearldine BienenstockIrish, Saree Krogh B, RN

## 2017-09-22 NOTE — Discharge Summary (Signed)
Physician Discharge Summary  Patient ID: Dylan Herrera Culley MRN: [id number] DOB/AGE: [birth year] 27 y.o.  Admit date: 09/20/2017 Discharge date: 09/21/2017  Admission Diagnoses:  Acute appendicitis  Discharge Diagnoses:  Same Active Problems:   Acute appendicitis   PROCEDURES: Laparoscopic appendectomy, 09/20/17, Dr. Mills Kollerhristopher White  Hospital Course:  Dylan Herrera Ariel is an 27 y.o. male who is here for acute onset RLQ pain - began yesterday in his RLQ; sharp. Made worse by ambulating. Never had this before. Pain does not radiate. Pain is severe. Some nausea, no emesis. Denies fevers/chills.  His wife, daughter and mother were present.  He was seen in the emergency department and taken the operating room by Dr. Cliffton AstersWhite.  Patient underwent laparoscopic appendectomy.  Following a.Herrera. he was up and mobile, tolerating a diet.  Did well with pain medications and was ready for discharge later that a.Herrera.  Condition on discharge: Improved.  CBC Latest Ref Rng & Units 09/21/2017 09/20/2017  WBC 4.0 - 10.5 K/uL 7.1 7.1  Hemoglobin 13.0 - 17.0 g/dL 40.913.4 81.114.5  Hematocrit 91.439.0 - 52.0 % 40.1 43.1  Platelets 150 - 400 K/uL 241 256   CMP Latest Ref Rng & Units 09/21/2017 09/20/2017  Glucose 65 - 99 mg/dL 83 84  BUN 6 - 20 mg/dL 9 15  Creatinine 7.820.61 - 1.24 mg/dL 9.561.06 2.130.97  Sodium 086135 - 145 mmol/L 139 138  Potassium 3.5 - 5.1 mmol/L 4.5 4.1  Chloride 101 - 111 mmol/L 102 104  CO2 22 - 32 mmol/L 27 23  Calcium 8.9 - 10.3 mg/dL 9.1 9.5  Total Protein 6.5 - 8.1 g/dL - 7.7  Total Bilirubin 0.3 - 1.2 mg/dL - 1.6(H)  Alkaline Phos 38 - 126 U/L - 47  AST 15 - 41 U/L - 27  ALT 17 - 63 U/L - 24   Condition on discharge: Improved.  Disposition: 01-Home or Self Care   Allergies as of 09/21/2017   No Known Allergies     Medication List    STOP taking these medications   azithromycin 500 MG tablet Commonly known as:  ZITHROMAX     TAKE these medications   acetaminophen 325 MG tablet Commonly  known as:  TYLENOL You can take 3 tablets every 6 hours as needed.  You can alternate with ibuprofen.  You can also alternate with oxycodone.   ibuprofen 200 MG tablet Commonly known as:  ADVIL,MOTRIN You can take 2 or 3 tablets every 6 hours as needed.  You can alternate this with plain Tylenol or oxycodone.  You can buy this over-the-counter at any drugstore.   oxyCODONE 5 MG immediate release tablet Commonly known as:  Oxy IR/ROXICODONE Take 1-2 tablets (5-10 mg total) by mouth every 6 (six) hours as needed for moderate pain, severe pain or breakthrough pain.      Follow-up Information    [facility].   Specialty:  General Surgery Why:  The office will call you with a follow-up in the [facility] clinic either today or tomorrow.  If you do not hear from them you can call tomorrow.  Be at the office 30 minutes early bring your insurance information and photo ID. Contact information: [address] [place] [address] [phone]           Signed: Sherrie GeorgeJENNINGS,Michaeljames Milnes 09/22/2017, 3:21 PM

## 2018-04-02 ENCOUNTER — Encounter: Payer: Self-pay | Admitting: Nurse Practitioner

## 2018-04-02 ENCOUNTER — Ambulatory Visit: Payer: Medicaid Other | Attending: Nurse Practitioner | Admitting: Nurse Practitioner

## 2018-04-02 VITALS — BP 133/83 | HR 64 | Temp 98.7°F | Ht 72.0 in | Wt 222.0 lb

## 2018-04-02 DIAGNOSIS — Z0000 Encounter for general adult medical examination without abnormal findings: Secondary | ICD-10-CM | POA: Diagnosis not present

## 2018-04-02 NOTE — Progress Notes (Signed)
Assessment & Plan:  Dylan Herrera was seen today for new patient (initial visit).  Diagnoses and all orders for this visit:  Routine adult health maintenance -     HIV antibody (with reflex)    Patient has been counseled on age-appropriate routine health concerns for screening and prevention. These are reviewed and up-to-date. Referrals have been placed accordingly. Immunizations are up-to-date or declined.    Subjective:   Chief Complaint  Patient presents with  . New Patient (Initial Visit)    Pt. is here to establish care.    HPI Dylan Herrera 27 y.o. male presents to office today to establish care. He does not have much by the way of past medical history.   Review of Systems  Constitutional: Negative for fever, malaise/fatigue and weight loss.  HENT: Negative.  Negative for nosebleeds.   Eyes: Negative.  Negative for blurred vision, double vision and photophobia.  Respiratory: Negative.  Negative for cough and shortness of breath.   Cardiovascular: Negative.  Negative for chest pain, palpitations and leg swelling.  Gastrointestinal: Negative.  Negative for heartburn, nausea and vomiting.  Musculoskeletal: Negative.  Negative for myalgias.  Neurological: Negative.  Negative for dizziness, focal weakness, seizures and headaches.  Psychiatric/Behavioral: Negative.  Negative for suicidal ideas.    Past Medical History:  Diagnosis Date  . Chicken pox   . Seasonal allergies     Past Surgical History:  Procedure Laterality Date  . ELBOW SURGERY  2008   foot ball injury, fx  . LAPAROSCOPIC APPENDECTOMY N/A 09/20/2017   Procedure: APPENDECTOMY LAPAROSCOPIC;  Surgeon: Andria MeuseWhite, Christopher M, MD;  Location: [facility] OR;  Service: General;  Laterality: N/A;    Family History  Problem Relation Age of Onset  . Heart disease Neg Hx   . Hyperlipidemia Neg Hx   . Diabetes Neg Hx     Social History Reviewed with no changes to be made today.   Outpatient Medications Prior to Visit    Medication Sig Dispense Refill  . acetaminophen (TYLENOL) 325 MG tablet You can take 3 tablets every 6 hours as needed.  You can alternate with ibuprofen.  You can also alternate with oxycodone. (Patient not taking: Reported on 04/02/2018)    . ibuprofen (ADVIL,MOTRIN) 200 MG tablet You can take 2 or 3 tablets every 6 hours as needed.  You can alternate this with plain Tylenol or oxycodone.  You can buy this over-the-counter at any drugstore. (Patient not taking: Reported on 04/02/2018)    . oxyCODONE (OXY IR/ROXICODONE) 5 MG immediate release tablet Take 1-2 tablets (5-10 mg total) by mouth every 6 (six) hours as needed for moderate pain, severe pain or breakthrough pain. (Patient not taking: Reported on 04/02/2018) 15 tablet 0   No facility-administered medications prior to visit.     No Known Allergies     Objective:    BP 133/83 (BP Location: Left Arm, Patient Position: Sitting, Cuff Size: Normal)   Pulse 64   Temp 98.7 F (37.1 C) (Oral)   Ht 6' (1.829 m)   Wt 222 lb (100.7 kg)   SpO2 97%   BMI 30.11 kg/m  Wt Readings from Last 3 Encounters:  04/02/18 222 lb (100.7 kg)  01/25/15 240 lb 11.2 oz (109.2 kg)    Physical Exam  Constitutional: He is oriented to person, place, and time. He appears well-developed and well-nourished. He is cooperative.  HENT:  Head: Normocephalic and atraumatic.  Eyes: EOM are normal.  Neck: Normal range of motion.  Cardiovascular: Normal rate, regular rhythm, normal heart sounds and intact distal pulses. Exam reveals no gallop and no friction rub.  No murmur heard. Pulmonary/Chest: Effort normal and breath sounds normal. No tachypnea. No respiratory distress. He has no decreased breath sounds. He has no wheezes. He has no rhonchi. He has no rales. He exhibits no tenderness.  Abdominal: Bowel sounds are normal.  Musculoskeletal: Normal range of motion. He exhibits no edema.  Neurological: He is alert and oriented to person, place, and time. Coordination  normal.  Skin: Skin is warm and dry.  Psychiatric: He has a normal mood and affect. His behavior is normal. Judgment and thought content normal.  Nursing note and vitals reviewed.      Patient has been counseled extensively about nutrition and exercise as well as the importance of adherence with medications and regular follow-up. The patient was given clear instructions to go to ER or return to medical center if symptoms don't improve, worsen or new problems develop. The patient verbalized understanding.   Follow-up: Return for Physical; double book with 850 patient on 04-05-2018.   Claiborne Rigg, FNP-BC [facility] [place], [place] [phone]   04/02/2018, 2:29 PM

## 2018-04-03 LAB — HIV ANTIBODY (ROUTINE TESTING W REFLEX): HIV Screen 4th Generation wRfx: NONREACTIVE

## 2018-04-05 ENCOUNTER — Encounter: Payer: Self-pay | Admitting: Nurse Practitioner

## 2018-04-05 ENCOUNTER — Ambulatory Visit: Payer: Medicaid Other | Attending: Nurse Practitioner | Admitting: Nurse Practitioner

## 2018-04-05 ENCOUNTER — Ambulatory Visit: Payer: BLUE CROSS/BLUE SHIELD

## 2018-04-05 VITALS — BP 130/76 | HR 58 | Temp 98.6°F | Ht 72.0 in | Wt 228.6 lb

## 2018-04-05 DIAGNOSIS — Z0000 Encounter for general adult medical examination without abnormal findings: Secondary | ICD-10-CM | POA: Diagnosis not present

## 2018-04-05 DIAGNOSIS — Z8489 Family history of other specified conditions: Secondary | ICD-10-CM | POA: Insufficient documentation

## 2018-04-05 DIAGNOSIS — R7989 Other specified abnormal findings of blood chemistry: Secondary | ICD-10-CM | POA: Diagnosis not present

## 2018-04-05 DIAGNOSIS — E010 Iodine-deficiency related diffuse (endemic) goiter: Secondary | ICD-10-CM | POA: Insufficient documentation

## 2018-04-05 DIAGNOSIS — Z111 Encounter for screening for respiratory tuberculosis: Secondary | ICD-10-CM | POA: Diagnosis not present

## 2018-04-05 NOTE — Patient Instructions (Signed)

## 2018-04-05 NOTE — Progress Notes (Signed)
Assessment & Plan:  Dylan Herrera was seen today for annual exam.  Diagnoses and all orders for this visit:  Encounter for annual physical exam F/U one year.   Thyromegaly -     TSH  Screening examination for pulmonary tuberculosis -     PPD   Patient has been counseled on age-appropriate routine health concerns for screening and prevention. These are reviewed and up-to-date. Referrals have been placed accordingly. Immunizations are up-to-date or declined.    Subjective:   Chief Complaint  Patient presents with  . Annual Exam    Pt. is here for a physical.   HPI Dylan Herrera 27 y.o. male presents to office today   Review of Systems  Constitutional: Negative for fever, malaise/fatigue and weight loss.  HENT: Negative.  Negative for nosebleeds.   Eyes: Negative.  Negative for blurred vision, double vision and photophobia.  Respiratory: Negative.  Negative for cough and shortness of breath.   Cardiovascular: Negative.  Negative for chest pain, palpitations and leg swelling.  Gastrointestinal: Negative.  Negative for heartburn, nausea and vomiting.  Genitourinary: Negative.   Musculoskeletal: Negative.  Negative for myalgias.  Skin: Negative.   Neurological: Negative.  Negative for dizziness, focal weakness, seizures and headaches.  Endo/Heme/Allergies: Positive for environmental allergies.  Psychiatric/Behavioral: Negative.  Negative for suicidal ideas.    Past Medical History:  Diagnosis Date  . Chicken pox   . Seasonal allergies     Past Surgical History:  Procedure Laterality Date  . ELBOW SURGERY  2008   foot ball injury, fx  . LAPAROSCOPIC APPENDECTOMY N/A 09/20/2017   Procedure: APPENDECTOMY LAPAROSCOPIC;  Surgeon: Andria Meuse, MD;  Location: [facility] OR;  Service: General;  Laterality: N/A;    Family History  Problem Relation Age of Onset  . Thyroid disease Mother   . Heart disease Neg Hx   . Hyperlipidemia Neg Hx   . Diabetes Neg Hx     Social  History Reviewed with no changes to be made today.   No outpatient medications prior to visit.   No facility-administered medications prior to visit.     No Known Allergies     Objective:    BP 130/76 (BP Location: Left Arm, Patient Position: Sitting, Cuff Size: Normal)   Pulse (!) 58   Temp 98.6 F (37 C) (Oral)   Wt 228 lb 9.6 oz (103.7 kg)   SpO2 95%   BMI 31.00 kg/m  Wt Readings from Last 3 Encounters:  04/05/18 228 lb 9.6 oz (103.7 kg)  04/02/18 222 lb (100.7 kg)  01/25/15 240 lb 11.2 oz (109.2 kg)    Physical Exam  Constitutional: He is oriented to person, place, and time. He appears well-developed and well-nourished.  HENT:  Head: Normocephalic and atraumatic.  Right Ear: External ear normal.  Left Ear: External ear normal.  Nose: Mucosal edema present.  Mouth/Throat: Oropharynx is clear and moist.  Eyes: Pupils are equal, round, and reactive to light. Conjunctivae, EOM and lids are normal. No scleral icterus. Right eye exhibits no nystagmus. Left eye exhibits no nystagmus.  Neck: Trachea normal, normal range of motion and full passive range of motion without pain. Neck supple. No JVD present. No tracheal deviation present. No Brudzinski's sign and no Kernig's sign noted. Thyromegaly present.  Cardiovascular: Normal rate, regular rhythm, normal heart sounds and intact distal pulses. Exam reveals no gallop and no friction rub.  No murmur heard. Pulses:      Dorsalis pedis pulses are 2+ on  the right side, and 2+ on the left side.       Posterior tibial pulses are 2+ on the right side, and 2+ on the left side.  Pulmonary/Chest: Effort normal and breath sounds normal. No respiratory distress. He has no wheezes. He has no rales. He exhibits no mass and no tenderness. Right breast exhibits no inverted nipple, no mass, no nipple discharge, no skin change and no tenderness. Left breast exhibits no inverted nipple, no mass, no nipple discharge, no skin change and no tenderness.   Abdominal: Soft. Bowel sounds are normal. He exhibits no distension and no mass. There is no tenderness. There is no rebound and no guarding. Hernia confirmed negative in the right inguinal area and confirmed negative in the left inguinal area.  Genitourinary: Testes normal and penis normal. Circumcised.  Musculoskeletal: He exhibits no edema, tenderness or deformity.       Right foot: There is normal range of motion.       Left foot: There is normal range of motion.  Feet:  Right Foot:  Skin Integrity: Positive for dry skin.  Left Foot:  Skin Integrity: Positive for dry skin.  Lymphadenopathy:    He has no cervical adenopathy.    He has no axillary adenopathy.       Right: No inguinal adenopathy present.       Left: No inguinal adenopathy present.  Neurological: He is alert and oriented to person, place, and time. He has normal strength. He displays normal reflexes. No cranial nerve deficit or sensory deficit. He exhibits normal muscle tone. Coordination normal.  Skin: Skin is warm and dry. No erythema.  Psychiatric: He has a normal mood and affect. His behavior is normal. Judgment and thought content normal.       Patient has been counseled extensively about nutrition and exercise as well as the importance of adherence with medications and regular follow-up. The patient was given clear instructions to go to ER or return to medical center if symptoms don't improve, worsen or new problems develop. The patient verbalized understanding.   Follow-up: Return if symptoms worsen or fail to improve.   Claiborne RiggZelda W Elena Cothern, FNP-BC [facility] [place], [place] [phone]   04/05/2018, 10:15 AM

## 2018-04-06 ENCOUNTER — Other Ambulatory Visit: Payer: Self-pay | Admitting: Nurse Practitioner

## 2018-04-06 DIAGNOSIS — R7989 Other specified abnormal findings of blood chemistry: Secondary | ICD-10-CM

## 2018-04-06 LAB — TSH: TSH: 4.86 u[IU]/mL — ABNORMAL HIGH (ref 0.450–4.500)

## 2018-04-06 NOTE — Addendum Note (Signed)
Addended by: Paschal DoppWHITE, Cierrah Dace J on: 04/06/2018 10:44 AM   Modules accepted: Orders

## 2018-04-07 ENCOUNTER — Ambulatory Visit: Payer: Medicaid Other | Attending: Family Medicine

## 2018-04-07 LAB — TB SKIN TEST: TB Skin Test: NEGATIVE

## 2018-04-08 LAB — T3: T3, Total: 143 ng/dL (ref 71–180)

## 2018-04-08 LAB — SPECIMEN STATUS REPORT

## 2018-04-08 LAB — T4, FREE: Free T4: 1.26 ng/dL (ref 0.82–1.77)

## 2018-04-09 ENCOUNTER — Encounter: Payer: Self-pay | Admitting: Nurse Practitioner

## 2019-04-18 DIAGNOSIS — Z20828 Contact with and (suspected) exposure to other viral communicable diseases: Secondary | ICD-10-CM | POA: Diagnosis not present

## 2019-07-06 ENCOUNTER — Other Ambulatory Visit: Payer: Self-pay

## 2019-07-06 ENCOUNTER — Ambulatory Visit: Payer: Medicaid Other | Attending: Family Medicine | Admitting: Physician Assistant

## 2019-07-06 VITALS — BP 159/93 | HR 79 | Ht 72.0 in | Wt 223.6 lb

## 2019-07-06 DIAGNOSIS — R05 Cough: Secondary | ICD-10-CM

## 2019-07-06 DIAGNOSIS — R5383 Other fatigue: Secondary | ICD-10-CM | POA: Diagnosis not present

## 2019-07-06 DIAGNOSIS — R030 Elevated blood-pressure reading, without diagnosis of hypertension: Secondary | ICD-10-CM | POA: Diagnosis not present

## 2019-07-06 DIAGNOSIS — Z20828 Contact with and (suspected) exposure to other viral communicable diseases: Secondary | ICD-10-CM

## 2019-07-06 DIAGNOSIS — R110 Nausea: Secondary | ICD-10-CM | POA: Diagnosis not present

## 2019-07-06 DIAGNOSIS — Z20822 Contact with and (suspected) exposure to covid-19: Secondary | ICD-10-CM

## 2019-07-06 NOTE — Patient Instructions (Signed)
This information is directly available on the CDC website: https://www.cdc.gov/coronavirus/2019-ncov/if-you-are-sick/steps-when-sick.html    Source:CDC Reference to specific commercial products, manufacturers, companies, or trademarks does not constitute its endorsement or recommendation by the U.S. Government, Department of Health and Human Services, or Centers for Disease Control and Prevention.  

## 2019-07-06 NOTE — Progress Notes (Signed)
Patient ID: Dylan Herrera, male   DOB: Mar 14, 1991, 28 y.o.   MRN: [id number]    Dylan Herrera, is a 28 y.o. male  [id number]:[id number]  [id number]  DOB - [birth year]  Subjective:  Chief Complaint and HPI: Dylan Herrera is a 28 y.o. male here today with 1 week h/o stomach upset.  Started suddenly.  Feeling more tired than usual.  Over the weekend he had runny nose and congestion with cough.  No fever.  Works in childcare setting.  No Vomiting or diarrhea.  +nausea, poor appetite and feels like he gets full quickly even if he hasn't eaten or even if he has eaten only a little.  No recent travel.  He went home early from work one day due to symptoms.  No change in taste or smell.  No SOB.  Cough and other URI symptoms only lasted 2 days and now resolved.   ROS:   Constitutional:  No f/c, No night sweats, No unexplained weight loss. EENT:  No vision changes, No blurry vision, No hearing changes. No other mouth, throat, or ear problems.  Respiratory: resolved cough, No SOB Cardiac: No CP, no palpitations GI:  No abd pain, No N/V/D. GU: No Urinary s/sx Musculoskeletal: No joint pain Neuro: No headache, no dizziness, no motor weakness.  Skin: No rash Endocrine:  No polydipsia. No polyuria.  Psych: Denies SI/HI  No problems updated.  ALLERGIES: No Known Allergies  PAST MEDICAL HISTORY: Past Medical History:  Diagnosis Date  . Chicken pox   . Seasonal allergies     MEDICATIONS AT HOME: Prior to Admission medications   Not on File     Objective:  EXAM:   Vitals:   07/06/19 1524  BP: (!) 159/93  Pulse: 79  SpO2: 98%  Weight: 223 lb 9.6 oz (101.4 kg)  Height: 6' (1.829 m)    General appearance : A&OX3. NAD. Non-toxic-appearing HEENT: Atraumatic and Normocephalic.  PERRLA. EOM intact.  Chest/Lungs:  Breathing-non-laboredExtremities: Neurology:  CN II-XII grossly intact, Non focal.   Psych:  TP linear. J/I WNL. Normal speech. Appropriate eye contact and affect.   Skin:  No Rash  Data Review Lab Results  Component Value Date   HGBA1C 5.6 01/25/2015     Assessment & Plan   1. Suspected COVID-19 virus infection Fluids, rest, respiratory care.  OOW through  11/9 may RTW 11/10 if improving.  Engineer, site.   - Novel Coronavirus, NAA ([facility])  2. Elevated BP without diagnosis of hypertension Check BP OOO 3 times weekly and record and bring to next visit   Patient have been counseled extensively about nutrition and exercise  Return in about 2 weeks (around 07/20/2019) for PCP follow up.  The patient was given clear instructions to go to ER or return to medical center if symptoms don't improve, worsen or new problems develop. The patient verbalized understanding. The patient was told to call to get lab results if they haven't heard anything in the next week.     Freeman Caldron, PA-C [facility] and [facility], [employer]   07/06/2019, 3:58 PM

## 2019-07-07 LAB — NOVEL CORONAVIRUS, NAA: SARS-CoV-2, NAA: NOT DETECTED

## 2019-07-26 ENCOUNTER — Ambulatory Visit (HOSPITAL_BASED_OUTPATIENT_CLINIC_OR_DEPARTMENT_OTHER): Payer: Medicaid Other | Admitting: Pharmacist

## 2019-07-26 ENCOUNTER — Encounter: Payer: Self-pay | Admitting: Critical Care Medicine

## 2019-07-26 ENCOUNTER — Ambulatory Visit: Payer: Medicaid Other | Attending: Critical Care Medicine | Admitting: Critical Care Medicine

## 2019-07-26 ENCOUNTER — Other Ambulatory Visit: Payer: Self-pay

## 2019-07-26 VITALS — BP 152/92 | HR 71 | Temp 98.5°F | Resp 16 | Wt 228.2 lb

## 2019-07-26 DIAGNOSIS — I10 Essential (primary) hypertension: Secondary | ICD-10-CM | POA: Insufficient documentation

## 2019-07-26 DIAGNOSIS — M722 Plantar fascial fibromatosis: Secondary | ICD-10-CM | POA: Diagnosis not present

## 2019-07-26 DIAGNOSIS — Z23 Encounter for immunization: Secondary | ICD-10-CM | POA: Diagnosis not present

## 2019-07-26 MED ORDER — MELOXICAM 7.5 MG PO TABS: 7.5000 mg | ORAL_TABLET | Freq: Every day | ORAL | 0 refills | Status: DC | PRN

## 2019-07-26 MED ORDER — BLOOD PRESSURE MONITOR 3 DEVI: 1.0000 [IU] | Freq: Every day | 0 refills | Status: DC

## 2019-07-26 NOTE — Patient Instructions (Addendum)
Begin meloxicam daily as needed for foot pain  A referral to sports medicine was made for your plantar fasciitis  A blood pressure cuff was sent to [facility] to be mailed to your home  Return in follow-up to see your primary care provider in 1 month with your blood pressure cuff readings   Influenza Virus Vaccine injection (Fluarix) What is this medicine? INFLUENZA VIRUS VACCINE (in floo EN zuh VAHY ruhs vak SEEN) helps to reduce the risk of getting influenza also known as the flu. This medicine may be used for other purposes; ask your health care provider or pharmacist if you have questions. COMMON BRAND NAME(S): Fluarix, Fluzone What should I tell my health care provider before I take this medicine? They need to know if you have any of these conditions:  bleeding disorder like hemophilia  fever or infection  Guillain-Barre syndrome or other neurological problems  immune system problems  infection with the human immunodeficiency virus (HIV) or AIDS  low blood platelet counts  multiple sclerosis  an unusual or allergic reaction to influenza virus vaccine, eggs, chicken proteins, latex, gentamicin, other medicines, foods, dyes or preservatives  pregnant or trying to get pregnant  breast-feeding How should I use this medicine? This vaccine is for injection into a muscle. It is given by a health care professional. A copy of Vaccine Information Statements will be given before each vaccination. Read this sheet carefully each time. The sheet may change frequently. Talk to your pediatrician regarding the use of this medicine in children. Special care may be needed. Overdosage: If you think you have taken too much of this medicine contact a poison control center or emergency room at once. NOTE: This medicine is only for you. Do not share this medicine with others. What if I miss a dose? This does not apply. What may interact with this medicine?  chemotherapy or radiation  therapy  medicines that lower your immune system like etanercept, anakinra, infliximab, and adalimumab  medicines that treat or prevent blood clots like warfarin  phenytoin  steroid medicines like prednisone or cortisone  theophylline  vaccines This list may not describe all possible interactions. Give your health care provider a list of all the medicines, herbs, non-prescription drugs, or dietary supplements you use. Also tell them if you smoke, drink alcohol, or use illegal drugs. Some items may interact with your medicine. What should I watch for while using this medicine? Report any side effects that do not go away within 3 days to your doctor or health care professional. Call your health care provider if any unusual symptoms occur within 6 weeks of receiving this vaccine. You may still catch the flu, but the illness is not usually as bad. You cannot get the flu from the vaccine. The vaccine will not protect against colds or other illnesses that may cause fever. The vaccine is needed every year. What side effects may I notice from receiving this medicine? Side effects that you should report to your doctor or health care professional as soon as possible:  allergic reactions like skin rash, itching or hives, swelling of the face, lips, or tongue Side effects that usually do not require medical attention (report to your doctor or health care professional if they continue or are bothersome):  fever  headache  muscle aches and pains  pain, tenderness, redness, or swelling at site where injected  weak or tired This list may not describe all possible side effects. Call your doctor for medical advice about  side effects. You may report side effects to FDA at 1-800-FDA-1088. Where should I keep my medicine? This vaccine is only given in a clinic, pharmacy, doctor's office, or other health care setting and will not be stored at home. NOTE: This sheet is a summary. It may not cover all  possible information. If you have questions about this medicine, talk to your doctor, pharmacist, or health care provider.  2020 Elsevier/Gold Standard (2008-03-15 09:30:40)

## 2019-07-26 NOTE — Progress Notes (Signed)
Subjective:    Patient ID: Dylan Herrera, male    DOB: 1991/04/24, 28 y.o.   MRN: [id number]  28 y.o. M here for foot pain  The patient states he may have injured his left foot 6 weeks ago and he states he has pain on the soles of his left foot.  He wears tennis shoes primarily.  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  He was athletic earlier but now does not run as often.  He does not do extreme exercise.  He does note if he tries to run for short distances he will have significant pain in the bottom of the left foot.  He denies any pain at night.  Note on arrival blood pressure is 152/92 get the patient denies prior history of known hypertension.  The patient denies chest pain or other complaints.  Past Medical History:  Diagnosis Date  . Chicken pox   . Seasonal allergies      Family History  Problem Relation Age of Onset  . Thyroid disease Mother   . Heart disease Neg Hx   . Hyperlipidemia Neg Hx   . Diabetes Neg Hx      Social History   Socioeconomic History  . Marital status: Single    Spouse name: Not on file  . Number of children: Not on file  . Years of education: Not on file  . Highest education level: Not on file  Occupational History  . Not on file  Social Needs  . Financial resource strain: Not on file  . Food insecurity    Worry: Not on file    Inability: Not on file  . Transportation needs    Medical: Not on file    Non-medical: Not on file  Tobacco Use  . Smoking status: Never Smoker  . Smokeless tobacco: Never Used  Substance and Sexual Activity  . Alcohol use: Yes    Alcohol/week: 0.0 standard drinks    Comment: 2-3 drinks about 1 - 2 times per week  . Drug use: No  . Sexual activity: Yes    Partners: Female  Lifestyle  . Physical activity    Days per week: Not on file    Minutes per session: Not on file  . Stress: Not on file  Relationships  . Social Musician on phone: Not on file    Gets together: Not  on file    Attends religious service: Not on file    Active member of club or organization: Not on file    Attends meetings of clubs or organizations: Not on file    Relationship status: Not on file  . Intimate partner violence    Fear of current or ex partner: Not on file    Emotionally abused: Not on file    Physically abused: Not on file    Forced sexual activity: Not on file  Other Topics Concern  . Not on file  Social History Narrative   Work or School: not employed currently      Home Situation: lives with girlfriend and 73 month old      Spiritual Beliefs: Christian      Lifestyle: basketball 1-2 days per week; diet is so so        No Known Allergies   No outpatient medications prior to visit.   No facility-administered medications prior to visit.       Review of Systems Constitutional:   No  weight loss, night sweats,  Fevers, chills, fatigue, lassitude. HEENT:   No headaches,  Difficulty swallowing,  Tooth/dental problems,  Sore throat,                No sneezing, itching, ear ache, nasal congestion, post nasal drip,   CV:  No chest pain,  Orthopnea, PND, swelling in lower extremities, anasarca, dizziness, palpitations  GI  No heartburn, indigestion, abdominal pain, nausea, vomiting, diarrhea, change in bowel habits, loss of appetite  Resp: No shortness of breath with exertion or at rest.  No excess mucus, no productive cough,  No non-productive cough,  No coughing up of blood.  No change in color of mucus.  No wheezing.  No chest wall deformity  Skin: no rash or lesions.  GU: no dysuria, change in color of urine, no urgency or frequency.  No flank pain.  MS:  No joint pain or swelling.  No decreased range of motion.  No back pain.  Foot pain on left  Psych:  No change in mood or affect. No depression or anxiety.  No memory loss.     Objective:   Physical Exam  BP (!) 152/92   Pulse 71   Temp 98.5 F (36.9 C) (Oral)   Resp 16   Wt 228 lb 3.2 oz  (103.5 kg)   SpO2 95%   BMI 30.95 kg/m   Gen: Pleasant, well-nourished, in no distress,  normal affect  ENT: No lesions,  mouth clear,  oropharynx clear, no postnasal drip  Neck: No JVD, no TMG, no carotid bruits  Lungs: No use of accessory muscles, no dullness to percussion, clear without rales or rhonchi  Cardiovascular: RRR, heart sounds normal, no murmur or gallops, no peripheral edema  Abdomen: soft and NT, no HSM,  BS normal  Musculoskeletal: No deformities, no cyanosis or clubbing There is tenderness on the sole of the left foot midway between the left foot middle arch and heel  Neuro: alert, non focal  Skin: Warm, no lesions or rashes        Assessment & Plan:  I personally reviewed all [facility] and [facility] as well as any outside material available during this office visit and agree with the  radiology impressions.   Plantar fasciitis The patient symptom complex most consistent with plantar fasciitis of the left foot  Patient does have Medicaid therefore will refer to sports medicine for evaluation  In the interim of asked the patient to use an insert over-the-counter design for plantar fasciitis in both tennis shoes and as well I gave him prescription for as needed Mobic 7.5 mg daily  HTN (hypertension) I asked the patient to watch his salt intake obtain a blood pressure monitor at home take measurements keep this down in a diary and return to see his primary care provider.  Also obtain a blood count and complete metabolic panel.   Dylan Herrera was seen today for foot pain.  Diagnoses and all orders for this visit:  Plantar fasciitis -     Ambulatory referral to Sports Medicine  Essential hypertension -     Comprehensive metabolic panel -     CBC with Differential/Platelet; Future -     CBC with Differential/Platelet  Other orders -     meloxicam (MOBIC) 7.5 MG tablet; Take 1 tablet (7.5 mg total) by mouth daily as needed (foot pain). -      Discontinue: Blood Pressure Monitoring (BLOOD PRESSURE MONITOR 3) DEVI; 1 Units  by Does not apply route daily. Measure blood pressure daily -     Blood Pressure Monitoring (BLOOD PRESSURE MONITOR 3) DEVI; 1 Units by Does not apply route daily. Measure blood pressure daily   The patient did receive a flu vaccine at this visit  I did review the CBC and complete metabolic panel and they are essentially within normal limits

## 2019-07-26 NOTE — Progress Notes (Signed)
Patient presents for vaccination against influenza per orders of Dr. Wright. Consent given. Counseling provided. No contraindications exists. Vaccine administered without incident.   

## 2019-07-27 LAB — CBC WITH DIFFERENTIAL/PLATELET
Basophils Absolute: 0 10*3/uL (ref 0.0–0.2)
Basos: 1 %
EOS (ABSOLUTE): 0.1 10*3/uL (ref 0.0–0.4)
Eos: 3 %
Hematocrit: 43.1 % (ref 37.5–51.0)
Hemoglobin: 14.7 g/dL (ref 13.0–17.7)
Immature Grans (Abs): 0 10*3/uL (ref 0.0–0.1)
Immature Granulocytes: 0 %
Lymphocytes Absolute: 1.4 10*3/uL (ref 0.7–3.1)
Lymphs: 43 %
MCH: 26.2 pg — ABNORMAL LOW (ref 26.6–33.0)
MCHC: 34.1 g/dL (ref 31.5–35.7)
MCV: 77 fL — ABNORMAL LOW (ref 79–97)
Monocytes Absolute: 0.3 10*3/uL (ref 0.1–0.9)
Monocytes: 10 %
Neutrophils Absolute: 1.4 10*3/uL (ref 1.4–7.0)
Neutrophils: 43 %
Platelets: 303 10*3/uL (ref 150–450)
RBC: 5.62 x10E6/uL (ref 4.14–5.80)
RDW: 13.3 % (ref 11.6–15.4)
WBC: 3.2 10*3/uL — ABNORMAL LOW (ref 3.4–10.8)

## 2019-07-27 LAB — COMPREHENSIVE METABOLIC PANEL
ALT: 16 IU/L (ref 0–44)
AST: 14 IU/L (ref 0–40)
Albumin/Globulin Ratio: 1.9 (ref 1.2–2.2)
Albumin: 4.7 g/dL (ref 4.1–5.2)
Alkaline Phosphatase: 38 IU/L — ABNORMAL LOW (ref 39–117)
BUN/Creatinine Ratio: 9 (ref 9–20)
BUN: 10 mg/dL (ref 6–20)
Bilirubin Total: 0.8 mg/dL (ref 0.0–1.2)
CO2: 25 mmol/L (ref 20–29)
Calcium: 9.5 mg/dL (ref 8.7–10.2)
Chloride: 102 mmol/L (ref 96–106)
Creatinine, Ser: 1.06 mg/dL (ref 0.76–1.27)
GFR calc Af Amer: 110 mL/min/{1.73_m2} (ref >59)
GFR calc non Af Amer: 95 mL/min/{1.73_m2} (ref >59)
Globulin, Total: 2.5 g/dL (ref 1.5–4.5)
Glucose: 78 mg/dL (ref 65–99)
Potassium: 4.3 mmol/L (ref 3.5–5.2)
Sodium: 142 mmol/L (ref 134–144)
Total Protein: 7.2 g/dL (ref 6.0–8.5)

## 2019-07-27 NOTE — Assessment & Plan Note (Signed)
The patient symptom complex most consistent with plantar fasciitis of the left foot  Patient does have Medicaid therefore will refer to sports medicine for evaluation  In the interim of asked the patient to use an insert over-the-counter design for plantar fasciitis in both tennis shoes and as well I gave him prescription for as needed Mobic 7.5 mg daily

## 2019-07-27 NOTE — Assessment & Plan Note (Signed)
I asked the patient to watch his salt intake obtain a blood pressure monitor at home take measurements keep this down in a diary and return to see his primary care provider.  Also obtain a blood count and complete metabolic panel.

## 2019-08-03 ENCOUNTER — Other Ambulatory Visit: Payer: Self-pay

## 2019-08-03 ENCOUNTER — Ambulatory Visit: Payer: Medicaid Other | Admitting: Sports Medicine

## 2019-08-03 ENCOUNTER — Encounter: Payer: Self-pay | Admitting: Sports Medicine

## 2019-08-03 VITALS — BP 132/92 | Ht 72.0 in | Wt 226.0 lb

## 2019-08-03 DIAGNOSIS — M2142 Flat foot [pes planus] (acquired), left foot: Secondary | ICD-10-CM

## 2019-08-03 DIAGNOSIS — M2141 Flat foot [pes planus] (acquired), right foot: Secondary | ICD-10-CM | POA: Diagnosis not present

## 2019-08-03 NOTE — Progress Notes (Signed)
PCP: Gildardo Pounds, NP  Subjective:   HPI: Patient is a 28 y.o. male here for left foot pain.  His pain is been present for the last several years intermittently.  Typically his pain is present after playing basketball.  The pain is located on the arch of his left foot.  The pain does not radiate.  He denies any specific injury or trauma.  Recently his pain is gotten worse and now he gets pain with just standing or walking.  Taking off his shoes or sitting down helps improve his pain.  He denies any numbness or tingling in his foot.  He has no bruising or swelling.  Patient notes his pain is currently a 2 out of 10.  When he does get the pain it has a sharp stabbing quality to it.  Patient notes he was given orthotics when he was a kid due to pes planus bilaterally however he has outgrown these and has not worn a pair of orthotics in many years.  He has not tried any other treatments.   Review of Systems: See HPI above.  Past Medical History:  Diagnosis Date  . Chicken pox   . Seasonal allergies     Current Outpatient Medications on File Prior to Visit  Medication Sig Dispense Refill  . Blood Pressure Monitoring (BLOOD PRESSURE MONITOR 3) DEVI 1 Units by Does not apply route daily. Measure blood pressure daily 1 each 0  . meloxicam (MOBIC) 7.5 MG tablet Take 1 tablet (7.5 mg total) by mouth daily as needed (foot pain). 30 tablet 0   No current facility-administered medications on file prior to visit.     Past Surgical History:  Procedure Laterality Date  . ELBOW SURGERY  2008   foot ball injury, fx  . LAPAROSCOPIC APPENDECTOMY N/A 09/20/2017   Procedure: APPENDECTOMY LAPAROSCOPIC;  Surgeon: Ileana Roup, MD;  Location: [facility] OR;  Service: General;  Laterality: N/A;    No Known Allergies  Social History   Socioeconomic History  . Marital status: Single    Spouse name: Not on file  . Number of children: Not on file  . Years of education: Not on file  . Highest education  level: Not on file  Occupational History  . Not on file  Social Needs  . Financial resource strain: Not on file  . Food insecurity    Worry: Not on file    Inability: Not on file  . Transportation needs    Medical: Not on file    Non-medical: Not on file  Tobacco Use  . Smoking status: Never Smoker  . Smokeless tobacco: Never Used  Substance and Sexual Activity  . Alcohol use: Yes    Alcohol/week: 0.0 standard drinks    Comment: 2-3 drinks about 1 - 2 times per week  . Drug use: No  . Sexual activity: Yes    Partners: Female  Lifestyle  . Physical activity    Days per week: Not on file    Minutes per session: Not on file  . Stress: Not on file  Relationships  . Social Herbalist on phone: Not on file    Gets together: Not on file    Attends religious service: Not on file    Active member of club or organization: Not on file    Attends meetings of clubs or organizations: Not on file    Relationship status: Not on file  . Intimate partner violence  Fear of current or ex partner: Not on file    Emotionally abused: Not on file    Physically abused: Not on file    Forced sexual activity: Not on file  Other Topics Concern  . Not on file  Social History Narrative   Work or School: not employed currently      Home Situation: lives with girlfriend and 55 month old      Spiritual Beliefs: Christian      Lifestyle: basketball 1-2 days per week; diet is so so       Family History  Problem Relation Age of Onset  . Thyroid disease Mother   . Heart disease Neg Hx   . Hyperlipidemia Neg Hx   . Diabetes Neg Hx         Objective:  Physical Exam: BP (!) 132/92   Ht 6' (1.829 m)   Wt 226 lb (102.5 kg)   BMI 30.65 kg/m  Gen: NAD, comfortable in exam room Lungs: Breathing comfortably on room air Ankle/Foot Exam Left -Inspection: Significant pes planus deformity.  Flattening of the transverse arch.  Pronation deformity with calcaneal valgus. -Palpation:  Tenderness palpation along the mid substance of patient's arch -ROM: Normal ROM with dorsiflexion, plantarflexion, inversion, eversion -Strength: Dorsiflexion: 5/5; Plantarflexion: 5/5; Inversion: 5/5; Eversion: 5/5 -Limb neurovascularly intact, no instability noted  Contralateral Ankle -Inspection: Significant pes planus deformity.  Flattening of the transverse arch.  Pronation deformity with calcaneal valgus. -Palpation: No tenderness palpation -ROM: Normal ROM with dorsiflexion, plantarflexion, inversion, eversion -Strength: Dorsiflexion: 5/5; Plantarflexion: 5/5; Inversion: 5/5; Eversion: 5/5 -Limb neurovascularly intact, no instability noted  -Gait: Pronation gait    Assessment & Plan:  Patient is a 28 y.o. male here for left foot pain  1.  Bilateral pes planus -Patient given green orthotic with large scaphoid pads.  After putting these in patient shoes patient noted an improvement in his arch pain.  Patient also had a correction of his pronation gait -Patient will continue to wear these orthotics during activity for the next month.  If his pain does not improve he will follow-up to see me -If patient's pain does improve we will consider making custom orthotics for him in the future.  Patient will come back on an as-needed basis to have custom orthotics made if he desires them

## 2019-08-03 NOTE — Patient Instructions (Signed)
Your foot pain is caused by flat arches in your feet.  In order to help with your pain we need to build up arch support in your shoe.  We do this by having you wear orthotics.  The orthotics I gave you today are temporary and will likely wear out after 3 to 6 months.  If these orthotics improve your pain we can make you custom orthotics which should last you several years.  Insurance does not always cover these orthotics so make sure to call your insurance company to see if they are covered first.  If your pain continues please come back to see me and we can reevaluate your feet.  If this improves your foot pain I will see you back on an as-needed basis.  If you would like custom orthotics made in the future please call to make an appointment and we can help arrange this for you.

## 2019-08-29 ENCOUNTER — Other Ambulatory Visit: Payer: Self-pay

## 2019-08-29 ENCOUNTER — Ambulatory Visit: Payer: Medicaid Other | Attending: Nurse Practitioner | Admitting: Nurse Practitioner

## 2019-08-29 ENCOUNTER — Encounter: Payer: Self-pay | Admitting: Nurse Practitioner

## 2019-08-29 VITALS — BP 145/86 | HR 58 | Temp 98.8°F | Ht 72.0 in | Wt 229.0 lb

## 2019-08-29 DIAGNOSIS — Z791 Long term (current) use of non-steroidal anti-inflammatories (NSAID): Secondary | ICD-10-CM | POA: Diagnosis not present

## 2019-08-29 DIAGNOSIS — R946 Abnormal results of thyroid function studies: Secondary | ICD-10-CM | POA: Diagnosis not present

## 2019-08-29 DIAGNOSIS — Z79899 Other long term (current) drug therapy: Secondary | ICD-10-CM | POA: Diagnosis not present

## 2019-08-29 DIAGNOSIS — M722 Plantar fascial fibromatosis: Secondary | ICD-10-CM

## 2019-08-29 DIAGNOSIS — R7989 Other specified abnormal findings of blood chemistry: Secondary | ICD-10-CM | POA: Diagnosis not present

## 2019-08-29 DIAGNOSIS — I10 Essential (primary) hypertension: Secondary | ICD-10-CM

## 2019-08-29 DIAGNOSIS — Z8349 Family history of other endocrine, nutritional and metabolic diseases: Secondary | ICD-10-CM | POA: Diagnosis not present

## 2019-08-29 DIAGNOSIS — D72819 Decreased white blood cell count, unspecified: Secondary | ICD-10-CM

## 2019-08-29 MED ORDER — AMLODIPINE BESYLATE 5 MG PO TABS: 5.0000 mg | ORAL_TABLET | Freq: Every day | ORAL | 3 refills | Status: DC

## 2019-08-29 MED ORDER — BLOOD PRESSURE MONITOR 3 DEVI: 1.0000 [IU] | Freq: Every day | 0 refills | Status: DC

## 2019-08-29 MED ORDER — MELOXICAM 7.5 MG PO TABS: 7.5000 mg | ORAL_TABLET | Freq: Every day | ORAL | 0 refills | Status: DC | PRN

## 2019-08-29 NOTE — Progress Notes (Signed)
Assessment & Plan:  Ziv was seen today for follow-up.  Diagnoses and all orders for this visit:  Essential hypertension -     amLODipine (NORVASC) 5 MG tablet; Take 1 tablet (5 mg total) by mouth daily. -     Blood Pressure Monitoring (BLOOD PRESSURE MONITOR 3) DEVI; 1 Units by Does not apply route daily. Measure blood pressure daily Continue all antihypertensives as prescribed.  Remember to bring in your blood pressure log with you for your follow up appointment.  DASH/Mediterranean Diets are healthier choices for HTN.   Elevated TSH -     Thyroid Panel With TSH  Leukopenia, unspecified type -     CBC  Plantar fasciitis -     meloxicam (MOBIC) 7.5 MG tablet; Take 1 tablet (7.5 mg total) by mouth daily as needed (foot pain). Will f/u with podiatry  Patient has been counseled on age-appropriate routine health concerns for screening and prevention. These are reviewed and up-to-date. Referrals have been placed accordingly. Immunizations are up-to-date or declined.    Subjective:   Chief Complaint  Patient presents with  . Follow-up    Pt. is here for a follow up.    HPI Dylan Herrera 28 y.o. male presents to office today for follow up to HTN.   Blood pressure is still elevated today. Will start low dose amlodipine 5 mg. He is not monitoring his blood pressure at home as he does not have a device. I have ordered him one through his insurance. Denies chest pain, shortness of breath, palpitations, lightheadedness, dizziness, headaches or BLE edema.  BP Readings from Last 3 Encounters:  08/29/19 (!) 145/86  08/03/19 (!) 132/92  07/26/19 (!) 152/92   Review of Systems  Constitutional: Negative for fever, malaise/fatigue and weight loss.  HENT: Negative.  Negative for nosebleeds.   Eyes: Negative.  Negative for blurred vision, double vision and photophobia.  Respiratory: Negative.  Negative for cough and shortness of breath.   Cardiovascular: Negative.  Negative for  chest pain, palpitations and leg swelling.  Gastrointestinal: Negative.  Negative for heartburn, nausea and vomiting.  Musculoskeletal: Positive for joint pain. Negative for myalgias.  Neurological: Negative.  Negative for dizziness, focal weakness, seizures and headaches.  Psychiatric/Behavioral: Negative.  Negative for suicidal ideas.    Past Medical History:  Diagnosis Date  . Chicken pox   . Seasonal allergies     Past Surgical History:  Procedure Laterality Date  . ELBOW SURGERY  2008   foot ball injury, fx  . LAPAROSCOPIC APPENDECTOMY N/A 09/20/2017   Procedure: APPENDECTOMY LAPAROSCOPIC;  Surgeon: Andria Meuse, MD;  Location: [facility] OR;  Service: General;  Laterality: N/A;    Family History  Problem Relation Age of Onset  . Thyroid disease Mother   . Heart disease Neg Hx   . Hyperlipidemia Neg Hx   . Diabetes Neg Hx     Social History Reviewed with no changes to be made today.   Outpatient Medications Prior to Visit  Medication Sig Dispense Refill  . Blood Pressure Monitoring (BLOOD PRESSURE MONITOR 3) DEVI 1 Units by Does not apply route daily. Measure blood pressure daily 1 each 0  . meloxicam (MOBIC) 7.5 MG tablet Take 1 tablet (7.5 mg total) by mouth daily as needed (foot pain). (Patient not taking: Reported on 08/29/2019) 30 tablet 0   No facility-administered medications prior to visit.    No Known Allergies     Objective:    BP (!) 145/86 (BP Location:  Left Arm, Patient Position: Sitting, Cuff Size: Large)   Pulse (!) 58   Temp 98.8 F (37.1 C) (Oral)   Ht 6' (1.829 m)   Wt 229 lb (103.9 kg)   SpO2 98%   BMI 31.06 kg/m  Wt Readings from Last 3 Encounters:  08/29/19 229 lb (103.9 kg)  08/03/19 226 lb (102.5 kg)  07/26/19 228 lb 3.2 oz (103.5 kg)    Physical Exam Vitals and nursing note reviewed.  Constitutional:      Appearance: He is well-developed.  HENT:     Head: Normocephalic and atraumatic.  Cardiovascular:     Rate and Rhythm:  Regular rhythm. Bradycardia present.     Heart sounds: Normal heart sounds. No murmur. No friction rub. No gallop.   Pulmonary:     Effort: Pulmonary effort is normal. No tachypnea or respiratory distress.     Breath sounds: Normal breath sounds. No decreased breath sounds, wheezing, rhonchi or rales.  Chest:     Chest wall: No tenderness.  Abdominal:     General: Bowel sounds are normal.     Palpations: Abdomen is soft.  Musculoskeletal:        General: Normal range of motion.     Cervical back: Normal range of motion.  Skin:    General: Skin is warm and dry.  Neurological:     Mental Status: He is alert and oriented to person, place, and time.     Coordination: Coordination normal.  Psychiatric:        Behavior: Behavior normal. Behavior is cooperative.        Thought Content: Thought content normal.        Judgment: Judgment normal.          Patient has been counseled extensively about nutrition and exercise as well as the importance of adherence with medications and regular follow-up. The patient was given clear instructions to go to ER or return to medical center if symptoms don't improve, worsen or new problems develop. The patient verbalized understanding.   Follow-up: Return in about 3 weeks (around 09/19/2019) for BP recheck .   Gildardo Pounds, FNP-BC [facility] and [facility] [place], [place]   08/29/2019, 9:36 PM

## 2019-08-30 LAB — THYROID PANEL WITH TSH
Free Thyroxine Index: 1.8 (ref 1.2–4.9)
T3 Uptake Ratio: 27 % (ref 24–39)
T4, Total: 6.8 ug/dL (ref 4.5–12.0)
TSH: 2.64 u[IU]/mL (ref 0.450–4.500)

## 2019-08-30 LAB — CBC
Hematocrit: 44.5 % (ref 37.5–51.0)
Hemoglobin: 15 g/dL (ref 13.0–17.7)
MCH: 27.1 pg (ref 26.6–33.0)
MCHC: 33.7 g/dL (ref 31.5–35.7)
MCV: 80 fL (ref 79–97)
Platelets: 292 10*3/uL (ref 150–450)
RBC: 5.54 x10E6/uL (ref 4.14–5.80)
RDW: 13.7 % (ref 11.6–15.4)
WBC: 3.2 10*3/uL — ABNORMAL LOW (ref 3.4–10.8)

## 2019-09-14 DIAGNOSIS — I10 Essential (primary) hypertension: Secondary | ICD-10-CM | POA: Diagnosis not present

## 2019-09-23 ENCOUNTER — Other Ambulatory Visit: Payer: Self-pay

## 2019-09-23 ENCOUNTER — Encounter: Payer: Self-pay | Admitting: Nurse Practitioner

## 2019-09-23 ENCOUNTER — Ambulatory Visit: Payer: Medicaid Other | Attending: Nurse Practitioner | Admitting: Nurse Practitioner

## 2019-09-23 VITALS — BP 145/91 | HR 59 | Temp 99.1°F | Ht 72.0 in | Wt 229.0 lb

## 2019-09-23 DIAGNOSIS — I10 Essential (primary) hypertension: Secondary | ICD-10-CM

## 2019-09-23 DIAGNOSIS — Z791 Long term (current) use of non-steroidal anti-inflammatories (NSAID): Secondary | ICD-10-CM | POA: Insufficient documentation

## 2019-09-23 DIAGNOSIS — Z8249 Family history of ischemic heart disease and other diseases of the circulatory system: Secondary | ICD-10-CM | POA: Insufficient documentation

## 2019-09-23 DIAGNOSIS — Z79899 Other long term (current) drug therapy: Secondary | ICD-10-CM | POA: Insufficient documentation

## 2019-09-23 NOTE — Progress Notes (Signed)
Assessment & Plan:  Dylan Herrera was seen today for follow-up.  Diagnoses and all orders for this visit:  Essential hypertension    Patient has been counseled on age-appropriate routine health concerns for screening and prevention. These are reviewed and up-to-date. Referrals have been placed accordingly. Immunizations are up-to-date or declined.    Subjective:   Chief Complaint  Patient presents with  . Follow-up    Pt. is here for a blood pressure check.    HPI Dylan Herrera 29 y.o. male presents to office today for follow up.  ESSENTIAL HYPERTENSION  Blood pressure is elevated today however Dylan Herrera reports Dylan Herrera did not pick up his amlodipine 5 mg from the pharmacy.  Dylan Herrera states the pharmacy never received the prescription. The pharmacy today states patient never picked up prescription and they did verify that prescription was sent and received to them on 12-28.  I have instructed Dylan Herrera to pick up his prescription today and monitor his blood pressure over the next 2 weeks and report his readings via MyChart.  Dylan Herrera verbalized understanding. Denies chest pain, shortness of breath, palpitations, lightheadedness, dizziness, headaches or BLE edema.  BP Readings from Last 3 Encounters:  09/23/19 (!) 145/91  08/29/19 (!) 145/86  08/03/19 (!) 132/92    Review of Systems  Constitutional: Negative for fever, malaise/fatigue and weight loss.  HENT: Negative.  Negative for nosebleeds.   Eyes: Negative.  Negative for blurred vision, double vision and photophobia.  Respiratory: Negative.  Negative for cough and shortness of breath.   Cardiovascular: Negative.  Negative for chest pain, palpitations and leg swelling.  Gastrointestinal: Negative.  Negative for heartburn, nausea and vomiting.  Musculoskeletal: Negative.  Negative for myalgias.  Neurological: Negative.  Negative for dizziness, focal weakness, seizures and headaches.  Psychiatric/Behavioral: Negative.  Negative for  suicidal ideas.    Past Medical History:  Diagnosis Date  . Chicken pox   . Seasonal allergies     Past Surgical History:  Procedure Laterality Date  . ELBOW SURGERY  2008   foot ball injury, fx  . LAPAROSCOPIC APPENDECTOMY N/A 09/20/2017   Procedure: APPENDECTOMY LAPAROSCOPIC;  Surgeon: Ileana Roup, MD;  Location: [facility] OR;  Service: General;  Laterality: N/A;    Family History  Problem Relation Age of Onset  . Thyroid disease Mother   . Heart disease Neg Hx   . Hyperlipidemia Neg Hx   . Diabetes Neg Hx     Social History Reviewed with no changes to be made today.   Outpatient Medications Prior to Visit  Medication Sig Dispense Refill  . Blood Pressure Monitoring (BLOOD PRESSURE MONITOR 3) DEVI 1 Units by Does not apply route daily. Measure blood pressure daily 1 each 0  . amLODipine (NORVASC) 5 MG tablet Take 1 tablet (5 mg total) by mouth daily. (Patient not taking: Reported on 09/23/2019) 90 tablet 3  . meloxicam (MOBIC) 7.5 MG tablet Take 1 tablet (7.5 mg total) by mouth daily as needed (foot pain). (Patient not taking: Reported on 09/23/2019) 30 tablet 0   No facility-administered medications prior to visit.    No Known Allergies     Objective:    BP (!) 145/91 (BP Location: Left Arm, Patient Position: Sitting, Cuff Size: Large)   Pulse (!) 59   Temp 99.1 F (37.3 C) (Oral)   Ht 6' (1.829 m)   Wt 229 lb (103.9 kg)   SpO2 96%   BMI 31.06 kg/m  Wt Readings from Last 3 Encounters:  09/23/19 229 lb (103.9 kg)  08/29/19 229 lb (103.9 kg)  08/03/19 226 lb (102.5 kg)    Physical Exam Vitals and nursing note reviewed.  Constitutional:      Appearance: Dylan Herrera is well-developed.  HENT:     Head: Normocephalic and atraumatic.  Cardiovascular:     Rate and Rhythm: Normal rate and regular rhythm.     Heart sounds: Normal heart sounds. No murmur. No friction rub. No gallop.   Pulmonary:     Effort: Pulmonary effort is normal. No tachypnea or respiratory  distress.     Breath sounds: Normal breath sounds. No decreased breath sounds, wheezing, rhonchi or rales.  Chest:     Chest wall: No tenderness.  Abdominal:     General: Bowel sounds are normal.     Palpations: Abdomen is soft.  Musculoskeletal:        General: Normal range of motion.     Cervical back: Normal range of motion.  Skin:    General: Skin is warm and dry.  Neurological:     Mental Status: Dylan Herrera is alert and oriented to person, place, and time.     Coordination: Coordination normal.  Psychiatric:        Behavior: Behavior normal. Behavior is cooperative.        Thought Content: Thought content normal.        Judgment: Judgment normal.          Patient has been counseled extensively about nutrition and exercise as well as the importance of adherence with medications and regular follow-up. The patient was given clear instructions to go to ER or return to medical center if symptoms don't improve, worsen or new problems develop. The patient verbalized understanding.   Follow-up: Return in about 3 months (around 12/22/2019) for HTN.   Claiborne Rigg, FNP-BC [facility] and [facility] [place], [place] [phone]   09/23/2019, 4:15 PM

## 2019-12-22 ENCOUNTER — Ambulatory Visit: Payer: Medicaid Other | Admitting: Nurse Practitioner

## 2019-12-23 ENCOUNTER — Ambulatory Visit: Payer: Medicaid Other | Admitting: Nurse Practitioner

## 2020-02-23 ENCOUNTER — Encounter (HOSPITAL_COMMUNITY): Payer: Self-pay | Admitting: *Deleted

## 2020-02-23 ENCOUNTER — Other Ambulatory Visit: Payer: Self-pay

## 2020-02-23 ENCOUNTER — Emergency Department (HOSPITAL_COMMUNITY)
Admission: EM | Admit: 2020-02-23 | Discharge: 2020-02-23 | Disposition: A | Discharge status: Home / Self Care | Payer: Medicaid Other | Attending: Emergency Medicine | Admitting: Emergency Medicine

## 2020-02-23 ENCOUNTER — Emergency Department (HOSPITAL_COMMUNITY): Payer: Medicaid Other

## 2020-02-23 DIAGNOSIS — S8992XA Unspecified injury of left lower leg, initial encounter: Secondary | ICD-10-CM | POA: Diagnosis not present

## 2020-02-23 DIAGNOSIS — Y998 Other external cause status: Secondary | ICD-10-CM | POA: Insufficient documentation

## 2020-02-23 DIAGNOSIS — Y9367 Activity, basketball: Secondary | ICD-10-CM | POA: Insufficient documentation

## 2020-02-23 DIAGNOSIS — Y92310 Basketball court as the place of occurrence of the external cause: Secondary | ICD-10-CM | POA: Insufficient documentation

## 2020-02-23 DIAGNOSIS — S8392XA Sprain of unspecified site of left knee, initial encounter: Secondary | ICD-10-CM | POA: Diagnosis not present

## 2020-02-23 DIAGNOSIS — I10 Essential (primary) hypertension: Secondary | ICD-10-CM | POA: Insufficient documentation

## 2020-02-23 DIAGNOSIS — X509XXA Other and unspecified overexertion or strenuous movements or postures, initial encounter: Secondary | ICD-10-CM | POA: Insufficient documentation

## 2020-02-23 DIAGNOSIS — M25562 Pain in left knee: Secondary | ICD-10-CM | POA: Diagnosis not present

## 2020-02-23 NOTE — ED Provider Notes (Signed)
[facility] EMERGENCY DEPARTMENT Provider Note   CSN: [id number] Arrival date & time: 02/23/20  1818     History Chief Complaint  Patient presents with  . Knee Pain    Dylan Herrera is a 29 y.o. male.  HPI 29 year old male presents with left knee injury.  He was playing basketball yesterday and do not for the first time in a long time and when he landed he felt a pop in his left knee.  Has pain superior to his patella.  The patient states that he has been having pain in his left knee after playing basketball for quite some time.  However this seems to be more severe.  Right now he is able to ambulate and finished the basketball game.  Pain is not that bad right now.  No knee swelling.  No numbness or weakness.   Past Medical History:  Diagnosis Date  . Chicken pox   . Seasonal allergies     Patient Active Problem List   Diagnosis Date Noted  . HTN (hypertension) 07/26/2019  . Plantar fasciitis 07/26/2019  . BMI 33.0-33.9,adult 01/25/2015    Past Surgical History:  Procedure Laterality Date  . ELBOW SURGERY  2008   foot ball injury, fx  . LAPAROSCOPIC APPENDECTOMY N/A 09/20/2017   Procedure: APPENDECTOMY LAPAROSCOPIC;  Surgeon: Andria Meuse, MD;  Location: [facility] OR;  Service: General;  Laterality: N/A;       Family History  Problem Relation Age of Onset  . Thyroid disease Mother   . Heart disease Neg Hx   . Hyperlipidemia Neg Hx   . Diabetes Neg Hx     Social History   Tobacco Use  . Smoking status: Never Smoker  . Smokeless tobacco: Never Used  Vaping Use  . Vaping Use: Some days  Substance Use Topics  . Alcohol use: Yes    Alcohol/week: 0.0 standard drinks    Comment: 2-3 drinks about 1 - 2 times per week  . Drug use: No    Home Medications Prior to Admission medications   Medication Sig Start Date End Date Taking? Authorizing Provider  amLODipine (NORVASC) 5 MG tablet Take 1 tablet (5 mg total) by mouth daily. Patient  not taking: Reported on 09/23/2019 08/29/19   Claiborne Rigg, NP  Blood Pressure Monitoring (BLOOD PRESSURE MONITOR 3) DEVI 1 Units by Does not apply route daily. Measure blood pressure daily 08/29/19   Claiborne Rigg, NP  meloxicam (MOBIC) 7.5 MG tablet Take 1 tablet (7.5 mg total) by mouth daily as needed (foot pain). Patient not taking: Reported on 09/23/2019 08/29/19   Claiborne Rigg, NP    Allergies    Patient has no known allergies.  Review of Systems   Review of Systems  Musculoskeletal: Positive for arthralgias. Negative for joint swelling.  Neurological: Negative for weakness and numbness.    Physical Exam Updated Vital Signs BP (!) 144/95 (BP Location: Right Arm)   Pulse 68   Temp 98.4 F (36.9 C) (Oral)   Resp 17   Ht 6' (1.829 m)   Wt 102.1 kg   SpO2 97%   BMI 30.52 kg/m   Physical Exam Vitals and nursing note reviewed.  Constitutional:      Appearance: He is well-developed.  HENT:     Head: Normocephalic and atraumatic.     Right Ear: External ear normal.     Left Ear: External ear normal.     Nose: Nose normal.  Eyes:  General:        Right eye: No discharge.        Left eye: No discharge.  Cardiovascular:     Rate and Rhythm: Normal rate and regular rhythm.     Pulses:          Dorsalis pedis pulses are 2+ on the left side.  Pulmonary:     Effort: Pulmonary effort is normal.  Abdominal:     General: There is no distension.  Musculoskeletal:     Cervical back: Neck supple.     Left knee: Tenderness (Mild, superior to the patella.  No defect of the quadriceps tendon.) present.     Comments: Patient is able to straight leg raise.  Normal strength/sensation  Skin:    General: Skin is warm and dry.  Neurological:     Mental Status: He is alert.  Psychiatric:        Mood and Affect: Mood is not anxious.     ED Results / Procedures / Treatments   Labs (all labs ordered are listed, but only abnormal results are displayed) Labs Reviewed -  No data to display  EKG None  Radiology DG Knee Complete 4 Views Left  Result Date: 02/23/2020 CLINICAL DATA:  Knee pain basketball injury EXAM: LEFT KNEE - COMPLETE 4+ VIEW COMPARISON:  None. FINDINGS: No evidence of fracture, dislocation, or joint effusion. No evidence of arthropathy or other focal bone abnormality. Soft tissues are unremarkable. IMPRESSION: Negative. Electronically Signed   By: Donavan Foil M.D.   On: 02/23/2020 19:34    Procedures Procedures (including critical care time)  Medications Ordered in ED Medications - No data to display  ED Course  I have reviewed the triage vital signs and the nursing notes.  Pertinent labs & imaging results that were available during my care of the patient were reviewed by me and considered in my medical decision making (see chart for details).    MDM Rules/Calculators/A&P                          Knee x-ray personally reviewed and shows no obvious effusion, fracture, dislocation.  Likely has a knee sprain.  No ligament laxity on exam.  Will place in a knee sleeve, recommend ibuprofen, and RICE.  Follow-up with sports medicine. Final Clinical Impression(s) / ED Diagnoses Final diagnoses:  Sprain of left knee, unspecified ligament, initial encounter    Rx / DC Orders ED Discharge Orders    None       Sherwood Gambler, MD 02/23/20 2203

## 2020-02-23 NOTE — ED Triage Notes (Signed)
Pt reports left knee pain since playing basketball yesterday. Ambulatory at triage.

## 2020-02-23 NOTE — Progress Notes (Signed)
Orthopedic Tech Progress Note Patient Details:  Dylan Herrera March 29, 1991 [id number]  Ortho Devices Type of Ortho Device: Knee Sleeve Ortho Device/Splint Location: LLE Ortho Device/Splint Interventions: Application   Post Interventions Patient Tolerated: Well Instructions Provided: Adjustment of device   Lucciana Head E Amberli Ruegg 02/23/2020, 10:55 PM

## 2020-03-01 DIAGNOSIS — Z419 Encounter for procedure for purposes other than remedying health state, unspecified: Secondary | ICD-10-CM | POA: Diagnosis not present

## 2020-04-01 DIAGNOSIS — Z419 Encounter for procedure for purposes other than remedying health state, unspecified: Secondary | ICD-10-CM | POA: Diagnosis not present

## 2020-05-02 DIAGNOSIS — Z419 Encounter for procedure for purposes other than remedying health state, unspecified: Secondary | ICD-10-CM | POA: Diagnosis not present

## 2020-06-01 DIAGNOSIS — Z419 Encounter for procedure for purposes other than remedying health state, unspecified: Secondary | ICD-10-CM | POA: Diagnosis not present

## 2020-07-02 DIAGNOSIS — Z419 Encounter for procedure for purposes other than remedying health state, unspecified: Secondary | ICD-10-CM | POA: Diagnosis not present

## 2020-07-25 DIAGNOSIS — M722 Plantar fascial fibromatosis: Secondary | ICD-10-CM | POA: Diagnosis not present

## 2020-07-25 DIAGNOSIS — M79672 Pain in left foot: Secondary | ICD-10-CM | POA: Diagnosis not present

## 2020-07-25 DIAGNOSIS — M2142 Flat foot [pes planus] (acquired), left foot: Secondary | ICD-10-CM | POA: Diagnosis not present

## 2020-07-25 DIAGNOSIS — M79671 Pain in right foot: Secondary | ICD-10-CM | POA: Diagnosis not present

## 2020-08-01 DIAGNOSIS — Z419 Encounter for procedure for purposes other than remedying health state, unspecified: Secondary | ICD-10-CM | POA: Diagnosis not present

## 2020-09-01 DIAGNOSIS — Z419 Encounter for procedure for purposes other than remedying health state, unspecified: Secondary | ICD-10-CM | POA: Diagnosis not present

## 2020-10-02 DIAGNOSIS — Z419 Encounter for procedure for purposes other than remedying health state, unspecified: Secondary | ICD-10-CM | POA: Diagnosis not present

## 2020-10-30 DIAGNOSIS — Z419 Encounter for procedure for purposes other than remedying health state, unspecified: Secondary | ICD-10-CM | POA: Diagnosis not present

## 2020-11-01 ENCOUNTER — Telehealth (INDEPENDENT_AMBULATORY_CARE_PROVIDER_SITE_OTHER): Payer: Medicaid Other | Admitting: Primary Care

## 2020-11-01 ENCOUNTER — Other Ambulatory Visit: Payer: Self-pay

## 2020-11-01 DIAGNOSIS — G8929 Other chronic pain: Secondary | ICD-10-CM

## 2020-11-01 DIAGNOSIS — M25562 Pain in left knee: Secondary | ICD-10-CM

## 2020-11-01 MED ORDER — MELOXICAM 7.5 MG PO TABS: 7.5000 mg | ORAL_TABLET | Freq: Every day | ORAL | 0 refills | Status: DC | PRN

## 2020-11-01 NOTE — Progress Notes (Signed)
Left knee pain  

## 2020-11-01 NOTE — Progress Notes (Addendum)
Telephone Note  I connected with Dylan Herrera on 11/01/20 at  2:30 PM EST by telephone and verified that I am speaking with the correct person using two identifiers.  Location: Patient: driving  Provider: Grayce Sessions @[facility]    I discussed the limitations, risks, security and privacy concerns of performing an evaluation and management service by telephone and the availability of in person appointments. I also discussed with the patient that there may be a patient responsible charge related to this service. The patient expressed understanding and agreed to proceed.   History of Present Illness: [name] is a 30 year old male having an acute visit left knee pain located above knee cap  5/10 aggravating factors are playing basket ball , jump, working out anything that involves his leg causes pain. Also , notices popping and stiffness tries to strength muscle     Past Medical History:  Diagnosis Date  . Chicken pox   . Seasonal allergies    Current Outpatient Medications on File Prior to Visit  Medication Sig Dispense Refill  . amLODipine (NORVASC) 5 MG tablet Take 1 tablet (5 mg total) by mouth daily. (Patient not taking: No sig reported) 90 tablet 3  . Blood Pressure Monitoring (BLOOD PRESSURE MONITOR 3) DEVI 1 Units by Does not apply route daily. Measure blood pressure daily 1 each 0  . meloxicam (MOBIC) 7.5 MG tablet Take 1 tablet (7.5 mg total) by mouth daily as needed (foot pain). (Patient not taking: No sig reported) 30 tablet 0   No current facility-administered medications on file prior to visit.    Observations/Objective: Pertinent positive and negative is noted in HPI   Assessment and Plan: Dylan Herrera was seen today for knee pain and hand pain.  Diagnoses and all orders for this visit:  Chronic pain of left knee May want to consider purchasing a knee brace to help with stability and pain.. May alternate with heat and ice application for pain relief.  Meloxicam  prescribed pain relief. Other alternatives include massage, acupuncture and water aerobics.  You must stay active and avoid a sedentary lifestyle. -     meloxicam (MOBIC) 7.5 MG tablet; Take 1 tablet (7.5 mg total) by mouth daily as needed knee pain  -     AMB referral to orthopedics    Follow Up Instructions:    I discussed the assessment and treatment plan with the patient. The patient was provided an opportunity to ask questions and all were answered. The patient agreed with the plan and demonstrated an understanding of the instructions.   The patient was advised to call back or seek an in-person evaluation if the symptoms worsen or if the condition fails to improve as anticipated.  I provided 12 minutes of non-face-to-face time during this encounter.   Domenic Schwab, NP

## 2020-11-12 NOTE — Addendum Note (Signed)
Addended by: Grayce Sessions on: 11/12/2020 07:43 PM   Modules accepted: Level of Service

## 2020-11-16 ENCOUNTER — Ambulatory Visit (INDEPENDENT_AMBULATORY_CARE_PROVIDER_SITE_OTHER): Payer: Medicaid Other | Admitting: Orthopedic Surgery

## 2020-11-16 ENCOUNTER — Encounter: Payer: Self-pay | Admitting: Orthopedic Surgery

## 2020-11-16 ENCOUNTER — Other Ambulatory Visit: Payer: Self-pay

## 2020-11-16 DIAGNOSIS — G8929 Other chronic pain: Secondary | ICD-10-CM

## 2020-11-16 DIAGNOSIS — M25562 Pain in left knee: Secondary | ICD-10-CM | POA: Diagnosis not present

## 2020-11-16 NOTE — Progress Notes (Signed)
Office Visit Note   Patient: Dylan Herrera           Date of Birth: 1990/09/12           MRN: [id number] Visit Date: 11/16/2020 Requested by: Grayce Sessions, NP [address] [place],  [place] [address] PCP: Claiborne Rigg, NP  Subjective: Chief Complaint  Patient presents with  . Left Knee - Pain    HPI: Dylan Herrera is a 30 y.o. male who presents to the office complaining of left knee pain.  Patient has long history of left knee pain that really only bothers him when he is active.  Localizes pain to the superior aspect of the patella at the insertion of the quadricep tendon.  Pain bothers him when he is playing basketball or working out.  Does not cause his knee to give out on him but does cause him to alter the way he plays ball.  Denies any medial or lateral sided pain.  Denies any instability or locking symptoms of the knee.  Has no history of left knee surgery or any significant injury to the left knee.  He did have severe pain following a game to the point where he had to go to the ED in December 2021.  Radiographs at that time were negative for any acute findings..                ROS: All systems reviewed are negative as they relate to the chief complaint within the history of present illness.  Patient denies fevers or chills.  Assessment & Plan: Visit Diagnoses:  1. Chronic pain of left knee     Plan: Patient is a 30 year old male who presents complaining of left knee pain.  He has had multiple year history of left knee pain that mostly bothers him when he is active.  He enjoys playing basketball and states that this is a significant limiter in his ability to play ball.  He had significantly increased pain after a game in 2021 and went to the ED with negative radiographs.  Upon further evaluation of these radiographs, there is a small enthesophyte at the superior aspect of the patella with what appears to be a fracture line through the base of the enthesophyte.   Suspect that this is the underlying cause of his symptoms.  Patient states that his symptoms bother him enough that he would consider surgery.  Given the longstanding nature of these symptoms and the fact that he has not had any significant improvement, plan to order MRI of the left knee for further evaluation of enthesophyte.  If there is no other potential causes of the knee pain, consider excision.  Patient agreed with this plan.  Follow-Up Instructions: No follow-ups on file.   Orders:  No orders of the defined types were placed in this encounter.  No orders of the defined types were placed in this encounter.     Procedures: No procedures performed   Clinical Data: No additional findings.  Objective: Vital Signs: There were no vitals taken for this visit.  Physical Exam:  Constitutional: Patient appears well-developed HEENT:  Head: Normocephalic Eyes:EOM are normal Neck: Normal range of motion Cardiovascular: Normal rate Pulmonary/chest: Effort normal Neurologic: Patient is alert Skin: Skin is warm Psychiatric: Patient has normal mood and affect  Ortho Exam: Ortho exam demonstrates left knee without effusion.  No tenderness over the medial or lateral joint lines.  No tenderness over the patellar tendon or the patella  though he does have some tenderness over the insertion of the quadricep tendon.  Able to perform straight leg raise and has excellent strength with this though he feels like his knee wants to "give out" when he tries to keep his leg up against resistance.  Does not feel this way with his right leg.  No instability to varus/valgus stress.  No laxity with anterior/posterior drawer.  Negative Lachman exam.  No pain with hip range of motion.  Specialty Comments:  No specialty comments available.  Imaging: No results found.   PMFS History: Patient Active Problem List   Diagnosis Date Noted  . HTN (hypertension) 07/26/2019  . Plantar fasciitis 07/26/2019  .  BMI 33.0-33.9,adult 01/25/2015   Past Medical History:  Diagnosis Date  . Chicken pox   . Seasonal allergies     Family History  Problem Relation Age of Onset  . Thyroid disease Mother   . Heart disease Neg Hx   . Hyperlipidemia Neg Hx   . Diabetes Neg Hx     Past Surgical History:  Procedure Laterality Date  . ELBOW SURGERY  2008   foot ball injury, fx  . LAPAROSCOPIC APPENDECTOMY N/A 09/20/2017   Procedure: APPENDECTOMY LAPAROSCOPIC;  Surgeon: Andria Meuse, MD;  Location: [facility] OR;  Service: General;  Laterality: N/A;   Social History   Occupational History  . Not on file  Tobacco Use  . Smoking status: Never Smoker  . Smokeless tobacco: Never Used  Vaping Use  . Vaping Use: Some days  Substance and Sexual Activity  . Alcohol use: Yes    Alcohol/week: 0.0 standard drinks    Comment: 2-3 drinks about 1 - 2 times per week  . Drug use: No  . Sexual activity: Yes    Partners: Female

## 2020-11-18 ENCOUNTER — Encounter: Payer: Self-pay | Admitting: Orthopedic Surgery

## 2020-11-30 DIAGNOSIS — Z419 Encounter for procedure for purposes other than remedying health state, unspecified: Secondary | ICD-10-CM | POA: Diagnosis not present

## 2020-12-07 ENCOUNTER — Ambulatory Visit
Admission: RE | Admit: 2020-12-07 | Discharge: 2020-12-07 | Disposition: A | Discharge status: Home / Self Care | Payer: Medicaid Other | Source: Ambulatory Visit | Attending: Orthopedic Surgery | Admitting: Orthopedic Surgery

## 2020-12-07 ENCOUNTER — Other Ambulatory Visit: Payer: Self-pay

## 2020-12-07 DIAGNOSIS — G8929 Other chronic pain: Secondary | ICD-10-CM

## 2020-12-07 DIAGNOSIS — M25562 Pain in left knee: Secondary | ICD-10-CM | POA: Diagnosis not present

## 2020-12-10 NOTE — Progress Notes (Signed)
Hey lauren, looks like Dylan Herrera does not have f/u visit to review MRI, could we get him an appiontment?

## 2020-12-10 NOTE — Progress Notes (Signed)
When is f/u?

## 2020-12-26 ENCOUNTER — Other Ambulatory Visit: Payer: Self-pay

## 2020-12-26 DIAGNOSIS — G8929 Other chronic pain: Secondary | ICD-10-CM

## 2020-12-26 DIAGNOSIS — M25562 Pain in left knee: Secondary | ICD-10-CM

## 2020-12-30 DIAGNOSIS — Z419 Encounter for procedure for purposes other than remedying health state, unspecified: Secondary | ICD-10-CM | POA: Diagnosis not present

## 2021-01-07 ENCOUNTER — Ambulatory Visit: Payer: Medicaid Other | Admitting: Orthopedic Surgery

## 2021-01-22 ENCOUNTER — Other Ambulatory Visit: Payer: Self-pay

## 2021-01-22 ENCOUNTER — Ambulatory Visit: Payer: Medicaid Other | Attending: Orthopedic Surgery | Admitting: Physical Therapy

## 2021-01-22 ENCOUNTER — Encounter: Payer: Self-pay | Admitting: Physical Therapy

## 2021-01-22 DIAGNOSIS — M6281 Muscle weakness (generalized): Secondary | ICD-10-CM | POA: Diagnosis not present

## 2021-01-22 DIAGNOSIS — M25562 Pain in left knee: Secondary | ICD-10-CM | POA: Diagnosis not present

## 2021-01-22 DIAGNOSIS — G8929 Other chronic pain: Secondary | ICD-10-CM | POA: Diagnosis not present

## 2021-01-22 NOTE — Therapy (Signed)
[facility] [address] [place], [place], [address] Phone: [phone]   Fax:  [phone]  Physical Therapy Evaluation  Patient Details  Name: Dylan Herrera MRN: [id number] Date of Birth: 1991-03-14 Referring Provider (PT): August Saucer Corrie Mckusick, MD   Encounter Date: 01/22/2021   PT End of Session - 01/22/21 1109    Visit Number 1    Number of Visits 8    Date for PT Re-Evaluation 03/19/21    Authorization Type [employer]    PT Start Time 1006    PT Stop Time 1050    PT Time Calculation (min) 44 min    Activity Tolerance Patient tolerated treatment well    Behavior During Therapy [facility] for tasks assessed/performed           Past Medical History:  Diagnosis Date  . Chicken pox   . Seasonal allergies     Past Surgical History:  Procedure Laterality Date  . ELBOW SURGERY  2008   foot ball injury, fx  . LAPAROSCOPIC APPENDECTOMY N/A 09/20/2017   Procedure: APPENDECTOMY LAPAROSCOPIC;  Surgeon: Andria Meuse, MD;  Location: [facility] OR;  Service: General;  Laterality: N/A;    There were no vitals filed for this visit.    Subjective Assessment - 01/22/21 1007    Subjective Patient reports reoccuring knee pain that started a few years ago while playing basketball, running, starting/stopping, change of direction. One day it really bothered him so he went to the ER, and eventually went to ortho who said he may have a bone spur. Pain is most located just on top of the knee cap and feels like he can rub it out. Patient reports he has trouble sitting extended periods and feels like he needs to stretch it out. Also has trouble standing extended periods, has to shift his weight and feels fidgety. When this first happened he was working at a job where he was on his feet standing and walking a lot which caused pain, currently working where he is not having to be on his feet as much is moving more while on his feet so not as bad.  Patient states he used to play basketball more frequently, but has had to stop playing as much as much so he doesn't have to miss work.    Limitations Sitting;Standing;Walking;House hold activities    How long can you sit comfortably? "a couple hours"    How long can you stand comfortably? "a good hour"    How long can you walk comfortably? no limitation    Diagnostic tests X-ray, MRI    Patient Stated Goals Get back to playing basketball without limitation    Currently in Pain? Yes    Pain Score 0-No pain   5/10 at worse   Pain Location Knee    Pain Orientation Left    Pain Descriptors / Indicators Sore;Aching    Pain Type Chronic pain    Pain Onset More than a month ago    Pain Frequency Intermittent    Aggravating Factors  Sitting extended periods, standing still extended periods, playing basketball, running, change of direction    Pain Relieving Factors Elevation, epson salt soak, massage the area    Effect of Pain on Daily Activities Limitation with exercise and athletic activities              [facility] PT Assessment - 01/22/21 0001      Assessment   Medical Diagnosis Chronic pain of left  knee    Referring Provider (PT) August Saucer Corrie Mckusick, MD    Onset Date/Surgical Date --   patient reports many year history   Next MD Visit Not scheduled    Prior Therapy No      Precautions   Precautions None      Restrictions   Weight Bearing Restrictions No      Balance Screen   Has the patient fallen in the past 6 months No    Has the patient had a decrease in activity level because of a fear of falling?  No    Is the patient reluctant to leave their home because of a fear of falling?  No      Home Environment   Living Environment Private residence    Type of Home Apartment   townhome   Home Access Stairs to enter    Entrance Stairs-Number of Steps 12-13    Home Layout Two level    Alternate Level Stairs-Number of Steps 10      Prior Function   Level of Independence  Independent    Vocation Full time employment    Vocation Requirements Walking more on feet, able to take rest breaks    Leisure Basketball      Cognition   Overall Cognitive Status Within Functional Limits for tasks assessed      Observation/Other Assessments   Observations Patient appears in no apparent distress    Focus on Therapeutic Outcomes (FOTO)  NA - [facility]    Other Surveys  Lower Extremity Functional Scale    Lower Extremity Functional Scale  74/80      Sensation   Light Touch Appears Intact      Coordination   Gross Motor Movements are Fluid and Coordinated Yes      Functional Tests   Functional tests Squat      Squat   Comments Patient exhibits excessive knees beyond toes, elevated heels, knee valgus, increased spinal flexion      ROM / Strength   AROM / PROM / Strength AROM;Strength      AROM   AROM Assessment Site Knee;Ankle    Right/Left Knee Right;Left    Right Knee Extension 135    Right Knee Flexion 5   hyper   Left Knee Extension 135    Left Knee Flexion 5   hyper   Right/Left Ankle Right;Left    Right Ankle Dorsiflexion 6    Left Ankle Dorsiflexion 4      Strength   Strength Assessment Site Hip;Knee;Ankle    Right/Left Hip Right;Left    Right Hip Flexion 5/5    Right Hip Extension 4/5    Right Hip ABduction 4+/5    Left Hip Flexion 4/5    Left Hip Extension 4/5    Left Hip ABduction 4/5    Right/Left Knee Right;Left    Right Knee Flexion 5/5    Right Knee Extension 5/5    Left Knee Flexion 4+/5    Left Knee Extension 4+/5    Right/Left Ankle Right;Left    Right Ankle Dorsiflexion 5/5    Right Ankle Plantar Flexion 5/5    Left Ankle Dorsiflexion 5/5    Left Ankle Plantar Flexion 5/5      Flexibility   Soft Tissue Assessment /Muscle Length yes    Hamstrings Limitation equal bilaterally    Quadriceps Limitation on left      Palpation   Patella mobility WFL    Palpation comment TTP proximal  patella, quad tendon region, rectus femoris       Transfers   Transfers Independent with all Transfers      Ambulation/Gait   Gait Pattern Within Functional Limits                      Objective measurements completed on examination: See above findings.       [facility] Adult PT Treatment/Exercise - 01/22/21 0001      Exercises   Exercises Knee/Hip      Knee/Hip Exercises: Stretches   Passive Hamstring Stretch 60 seconds    Passive Hamstring Stretch Limitations supine with strap    Quad Stretch 60 seconds    Quad Stretch Limitations prone with strap    Gastroc Stretch 60 seconds    Gastroc Stretch Limitations standing runner at wall      Knee/Hip Exercises: Standing   Functional Squat 2 sets;10 reps    Functional Squat Limitations Goblet squat to ~24" surface with 10#, cueing for proper technique      Knee/Hip Exercises: Supine   Single Leg Bridge 10 reps   3 sec hold, figure-4 position     Knee/Hip Exercises: Sidelying   Clams Green band x 15                  PT Education - 01/22/21 1108    Education Details Exam findings, possible etiology of symptoms, POC, HEP    Person(s) Educated Patient    Methods Explanation;Demonstration;Tactile cues;Verbal cues;Handout    Comprehension Verbalized understanding;Returned demonstration;Verbal cues required;Tactile cues required;Need further instruction            PT Short Term Goals - 01/22/21 1117      PT SHORT TERM GOAL #1   Title Patient will be I with initial HEP to progress with PT    Baseline provided patient with HEP at eval    Time 4    Period Weeks    Status New    Target Date 02/19/21      PT SHORT TERM GOAL #2   Title Patient will demonstrate appropriate squat form without knee dominance or valgus to reduce stress placed on knee with functional movements    Baseline Patient exhibits excessive knees beyond toes, elevated heels, knee valgus, increased spinal flexion    Time 4    Period Weeks    Status New    Target Date 02/19/21       PT SHORT TERM GOAL #3   Title Patient will exhibit >/= 8 deg ankle dorsiflexion to demonstrate improved calf flexibility and better squat technique    Baseline Left ankle DF 4 deg, Right ankle DF 6 deg    Time 4    Period Weeks    Status New    Target Date 02/19/21             PT Long Term Goals - 01/22/21 1123      PT LONG TERM GOAL #1   Title Patient will be I with final HEP to maintain progress from PT    Baseline initial HEP provided at eval    Time 8    Period Weeks    Status New    Target Date 03/19/21      PT LONG TERM GOAL #2   Title Patient will report LEFS score >/= 78/80 to demonstrate improved ability to perform activities such as basketball and running    Baseline LEFS 74/80    Time 8  Period Weeks    Status New    Target Date 03/19/21      PT LONG TERM GOAL #3   Title Patient will exhibit left hip strength >/= grossly 4+/5 MMT to improve dynamic knee control with jump and landing tasks    Baseline left hip strength grossly 4/5 MMT    Time 8    Period Weeks    Status New    Target Date 03/19/21      PT LONG TERM GOAL #4   Title Patient will demonstrate left knee strength = 5/5 MMT to reduce stress placed on knee with basketball movements and improve activity tolerance    Baseline left knee strength 4+/5 MMT    Time 8    Period Weeks    Status New    Target Date 03/19/21      PT LONG TERM GOAL #5   Title Patient will report ability to play basketball without limitation and with </= 2/10 residual left knee soreness to improve ability to exercise    Baseline left knee 5/10 soreness    Time 8    Period Weeks    Status New    Target Date 03/19/21                  Plan - 01/22/21 1110    Clinical Impression Statement Patient presents to PT with report of chronic left knee pain that occurs primarily with activity. Patient's symptoms seem most consisten with patellofemoral pain vs quad tendinopathy. He exhibits limitations in left side hip and  knee strength, flexibility or the quad, calf, and hamstring  muscles, and impaired squat for that places greater stress on the knee. Patient provided exercises to initiate flexibility and strengthening and he would benefit from continued skilled PT to progress his strength and movement control in order to reduce pain and allow patient to return to activities such as basketball.    Personal Factors and Comorbidities Time since onset of injury/illness/exacerbation    Examination-Activity Limitations Locomotion Level;Sit;Squat;Stand    Examination-Participation Restrictions Community Activity;Occupation;Driving    Stability/Clinical Decision Making Stable/Uncomplicated    Clinical Decision Making Low    Rehab Potential Good    PT Frequency 1x / week    PT Duration 8 weeks    PT Treatment/Interventions ADLs/Self Care Home Management;Aquatic Therapy;Cryotherapy;Electrical Stimulation;Iontophoresis 4mg /ml Dexamethasone;Moist Heat;Ultrasound;Neuromuscular re-education;Balance training;Therapeutic exercise;Therapeutic activities;Functional mobility training;Stair training;Gait training;Patient/family education;Manual techniques;Dry needling;Passive range of motion;Taping;Joint Manipulations    PT Next Visit Plan Review HEP and progress PRN, manual/dry needling for left quad region, progress hip and quad strengthening (squats, step-ups, lunges, leg press, deadlift)    PT Home Exercise Plan [email or web address]    Consulted and Agree with Plan of Care Patient           Patient will benefit from skilled therapeutic intervention in order to improve the following deficits and impairments:  Decreased activity tolerance,Pain,Impaired flexibility,Improper body mechanics,Decreased strength  Visit Diagnosis: Chronic pain of left knee  Muscle weakness (generalized)     Problem List Patient Active Problem List   Diagnosis Date Noted  . HTN (hypertension) 07/26/2019  . Plantar fasciitis 07/26/2019  . BMI  33.0-33.9,adult 01/25/2015    01/27/2015, PT, DPT, LAT, ATC 01/22/21  11:41 AM Phone: [phone] Fax: [phone]   [facility] Out[facility] [address] [place], [place], [place] Phone: [phone]   Fax:  [phone]  Name: Dylan Herrera MRN: Glyn Ade Date of Birth: 1991-07-11   [facility] Authorization  Choose one: Rehabilitative  Standardized Assessment or Functional Outcome Tool: See Pain Assessment and LEFS  Score or Percent Disability: 74/80  Body Parts Treated (Select each separately):  1. Knee. Overall deficits/functional limitations for body part selected: mild 2. N/A. Overall deficits/functional limitations for body part selected: N/A 3. N/A. Overall deficits/functional limitations for body part selected: N/A  Check all possible CPT codes: 16109- Therapeutic Exercise, [phone]- Neuro Re-education, [phone] - Gait Training, [phone] - Manual Therapy, 97530 - Therapeutic Activities, 97535 - Self Care, [phone] - Iontophoresis, [id number] - Ultrasound and [id number] - Aquatic therapy

## 2021-01-22 NOTE — Patient Instructions (Signed)
Access Code: [facility] URL: [email or web address] Date: 01/22/2021 Prepared by: Rosana Hoes  Exercises Prone Quadriceps Stretch with Strap - 1-2 x daily - 7 x weekly - 2 reps - 60 hold Supine Hamstring Stretch with Strap - 1-2 x daily - 7 x weekly - 2 reps - 60 hold Gastroc Stretch on Wall - 1-2 x daily - 7 x weekly - 2 reps - 60 hold Clamshell with Resistance - 1 x daily - 5 x weekly - 3 sets - 15 reps Figure 4 Bridge - 1 x daily - 5 x weekly - 3 sets - 10 reps - 3 hold Squat with Chair Touch - 1 x daily - 5 x weekly - 3 sets - 10 reps

## 2021-01-30 DIAGNOSIS — Z419 Encounter for procedure for purposes other than remedying health state, unspecified: Secondary | ICD-10-CM | POA: Diagnosis not present

## 2021-01-31 ENCOUNTER — Ambulatory Visit: Payer: Medicaid Other | Admitting: Physical Therapy

## 2021-02-06 ENCOUNTER — Ambulatory Visit: Payer: Medicaid Other | Attending: Orthopedic Surgery | Admitting: Physical Therapy

## 2021-02-07 ENCOUNTER — Telehealth: Payer: Self-pay | Admitting: Physical Therapy

## 2021-02-07 NOTE — Telephone Encounter (Signed)
Attempted to contact patient due to missed PT appointment. Left VM informing of missed appointment and reminder of next scheduled appointment and attendance policy.  Rosana Hoes, PT, DPT, LAT, ATC 02/07/21  10:03 AM Phone: [phone] Fax: [phone]

## 2021-02-14 ENCOUNTER — Ambulatory Visit: Payer: Medicaid Other | Admitting: Physical Therapy

## 2021-02-15 ENCOUNTER — Telehealth: Payer: Self-pay | Admitting: Physical Therapy

## 2021-02-15 NOTE — Telephone Encounter (Signed)
Attempted to contact patient due to 2nd consecutive missed PT appointment. Left VM informing patient of missed PT appointment and due to attendance policy his remaining appointments would be canceled. Patient instructed to contact the clinic if he would like to schedule any further appointments but would only be able to schedule 1 visit at a time.  Rosana Hoes, PT, DPT, LAT, ATC 02/15/21  1:57 PM Phone: [phone] Fax: [phone]

## 2021-02-20 ENCOUNTER — Ambulatory Visit: Payer: Medicaid Other | Admitting: Physical Therapy

## 2021-03-01 DIAGNOSIS — Z419 Encounter for procedure for purposes other than remedying health state, unspecified: Secondary | ICD-10-CM | POA: Diagnosis not present

## 2021-04-01 DIAGNOSIS — Z419 Encounter for procedure for purposes other than remedying health state, unspecified: Secondary | ICD-10-CM | POA: Diagnosis not present

## 2021-05-02 DIAGNOSIS — Z419 Encounter for procedure for purposes other than remedying health state, unspecified: Secondary | ICD-10-CM | POA: Diagnosis not present

## 2021-05-04 ENCOUNTER — Ambulatory Visit (HOSPITAL_COMMUNITY)
Admission: EM | Admit: 2021-05-04 | Discharge: 2021-05-04 | Discharge status: Against Medical Advice | Payer: Medicaid Other

## 2021-05-04 ENCOUNTER — Ambulatory Visit (HOSPITAL_COMMUNITY): Payer: Self-pay

## 2021-05-04 NOTE — ED Notes (Signed)
Pt notified Patient Access that he wishes to make an appt for tomorrow; doesn't wish to wait today.

## 2021-05-08 ENCOUNTER — Other Ambulatory Visit: Payer: Self-pay

## 2021-05-08 ENCOUNTER — Encounter (HOSPITAL_COMMUNITY): Payer: Self-pay

## 2021-05-08 ENCOUNTER — Ambulatory Visit (HOSPITAL_COMMUNITY)
Admission: RE | Admit: 2021-05-08 | Discharge: 2021-05-08 | Disposition: A | Discharge status: Home / Self Care | Payer: Medicaid Other | Source: Ambulatory Visit | Attending: Internal Medicine | Admitting: Internal Medicine

## 2021-05-08 VITALS — BP 147/90 | HR 53 | Temp 98.4°F | Resp 16

## 2021-05-08 DIAGNOSIS — L03116 Cellulitis of left lower limb: Secondary | ICD-10-CM | POA: Diagnosis not present

## 2021-05-08 DIAGNOSIS — L02416 Cutaneous abscess of left lower limb: Secondary | ICD-10-CM

## 2021-05-08 DIAGNOSIS — L0291 Cutaneous abscess, unspecified: Secondary | ICD-10-CM

## 2021-05-08 MED ORDER — LIDOCAINE HCL (PF) 1 % IJ SOLN
INTRAMUSCULAR | Status: AC
  Filled 2021-05-08: qty 30

## 2021-05-08 MED ORDER — DOXYCYCLINE HYCLATE 100 MG PO CAPS: 100.0000 mg | ORAL_CAPSULE | Freq: Two times a day (BID) | ORAL | 0 refills | Status: AC

## 2021-05-08 MED ORDER — BACITRACIN ZINC 500 UNIT/GM EX OINT
TOPICAL_OINTMENT | CUTANEOUS | Status: AC
  Filled 2021-05-08: qty 4.5

## 2021-05-08 NOTE — ED Triage Notes (Signed)
Pt presents with possible spider bite xs 1 week.

## 2021-05-08 NOTE — ED Provider Notes (Signed)
[facility]    CSN: [id number] Arrival date & time: 05/08/21  5035      History   Chief Complaint Chief Complaint  Patient presents with   Insect Bite    APPT    HPI Dylan Herrera is a 30 y.o. male otherwise healthy presents to urgent care today with reports of a spider bite to left thigh.  Patient reports noticing swelling and mid thigh 1 week ago.  States the area has remained painful and swollen.  Denies any drainage from the area and has not tried anything to alleviate symptoms.  Patient denies any history of MRSA.  He denies any recent fever or chills.   Past Medical History:  Diagnosis Date   Chicken pox    Seasonal allergies     Patient Active Problem List   Diagnosis Date Noted   HTN (hypertension) 07/26/2019   Plantar fasciitis 07/26/2019   BMI 33.0-33.9,adult 01/25/2015    Past Surgical History:  Procedure Laterality Date   ELBOW SURGERY  2008   foot ball injury, fx   LAPAROSCOPIC APPENDECTOMY N/A 09/20/2017   Procedure: APPENDECTOMY LAPAROSCOPIC;  Surgeon: Andria Meuse, MD;  Location: [facility] OR;  Service: General;  Laterality: N/A;       Home Medications    Prior to Admission medications   Medication Sig Start Date End Date Taking? Authorizing Provider  doxycycline (VIBRAMYCIN) 100 MG capsule Take 1 capsule (100 mg total) by mouth 2 (two) times daily for 5 days. 05/08/21 05/13/21 Yes Rolla Etienne, NP  amLODipine (NORVASC) 5 MG tablet Take 1 tablet (5 mg total) by mouth daily. Patient not taking: No sig reported 08/29/19   Claiborne Rigg, NP  Blood Pressure Monitoring (BLOOD PRESSURE MONITOR 3) DEVI 1 Units by Does not apply route daily. Measure blood pressure daily 08/29/19   Claiborne Rigg, NP  meloxicam (MOBIC) 7.5 MG tablet Take 1 tablet (7.5 mg total) by mouth daily as needed (foot pain). 11/01/20   Grayce Sessions, NP    Family History Family History  Problem Relation Age of Onset   Thyroid disease Mother    Heart  disease Neg Hx    Hyperlipidemia Neg Hx    Diabetes Neg Hx     Social History Social History   Tobacco Use   Smoking status: Never   Smokeless tobacco: Never  Vaping Use   Vaping Use: Some days  Substance Use Topics   Alcohol use: Yes    Alcohol/week: 0.0 standard drinks    Comment: 2-3 drinks about 1 - 2 times per week   Drug use: No     Allergies   Patient has no known allergies.   Review of Systems As stated in HPI otherwise negative   Physical Exam Triage Vital Signs ED Triage Vitals  Enc Vitals Group     BP 05/08/21 1011 (!) 147/90     Pulse Rate 05/08/21 1011 (!) 53     Resp 05/08/21 1011 16     Temp 05/08/21 1011 98.4 F (36.9 C)     Temp Source 05/08/21 1011 Oral     SpO2 05/08/21 1011 98 %     Weight --      Height --      Head Circumference --      Peak Flow --      Pain Score 05/08/21 1013 0     Pain Loc --      Pain Edu? --  Excl. in GC? --    No data found.  Updated Vital Signs BP (!) 147/90 (BP Location: Right Wrist)   Pulse (!) 53   Temp 98.4 F (36.9 C) (Oral)   Resp 16   SpO2 98%   Visual Acuity Right Eye Distance:   Left Eye Distance:   Bilateral Distance:    Right Eye Near:   Left Eye Near:    Bilateral Near:     Physical Exam Constitutional:      General: He is not in acute distress.    Appearance: Normal appearance. He is not ill-appearing or toxic-appearing.  Skin:    General: Skin is warm and dry.     Comments: Approximately 5 x 5cm area of erythema on left mid thigh with central area of induration.  No drainage.  No streaking  Neurological:     General: No focal deficit present.     Mental Status: He is alert and oriented to person, place, and time.  Psychiatric:        Mood and Affect: Mood normal.        Behavior: Behavior normal.     UC Treatments / Results  Labs (all labs ordered are listed, but only abnormal results are displayed) Labs Reviewed - No data to display  EKG   Radiology No results  found.  Procedures Incision and Drainage  Date/Time: 05/08/2021 1:31 PM Performed by: Rolla Etienne, NP Authorized by: Rolla Etienne, NP   Consent:    Consent obtained:  Verbal   Risks, benefits, and alternatives were discussed: yes     Risks discussed:  Incomplete drainage and pain   Alternatives discussed:  No treatment Universal protocol:    Patient identity confirmed:  Verbally with patient Location:    Type:  Abscess   Location:  Lower extremity   Lower extremity location:  Leg   Leg location:  L upper leg Pre-procedure details:    Skin preparation:  Povidone-iodine Procedure type:    Complexity:  Simple Procedure details:    Incision types:  Stab incision   Incision depth:  Dermal   Drainage:  Bloody and serosanguinous   Drainage amount:  Scant   Packing materials:  None Post-procedure details:    Procedure completion:  Tolerated Comments:     Patient did become slightly nauseous with apparent vagal response during procedure.  Symptoms quickly resolved with lying down and oral hydration. (including critical care time)  Medications Ordered in UC Medications - No data to display  Initial Impression / Assessment and Plan / UC Course  I have reviewed the triage vital signs and the nursing notes.  Pertinent labs & imaging results that were available during my care of the patient were reviewed by me and considered in my medical decision making (see chart for details).  Abscess Cellulitis, mild -Folliculitis though uncertain etiology -Scant amount of serosanguineous drainage with I&D.  Patient tolerated procedure okay.  He did appear to have a vagal response but recovered quickly after lying down and oral hydration -We will treat empirically with p.o. Doxy twice daily x5 days given surrounding erythema -To follow-up for any worsening redness, fever or chills  Reviewed expections re: course of current medical issues. Questions answered. Outlined signs and symptoms  indicating need for more acute intervention. Pt verbalized understanding. AVS given  Final Clinical Impressions(s) / UC Diagnoses   Final diagnoses:  Abscess  Cellulitis of left lower extremity     Discharge Instructions  Area may continue to drain which is okay.  Warm compresses may help to encourage the drainage.  Take antibiotic as prescribed.  You will need to return for any fever, chills or worsening redness     ED Prescriptions     Medication Sig Dispense Auth. Provider   doxycycline (VIBRAMYCIN) 100 MG capsule Take 1 capsule (100 mg total) by mouth 2 (two) times daily for 5 days. 10 capsule Rolla Etienne, NP      PDMP not reviewed this encounter.   Rolla Etienne, NP 05/08/21 1335

## 2021-05-08 NOTE — Discharge Instructions (Addendum)
Area may continue to drain which is okay.  Warm compresses may help to encourage the drainage.  Take antibiotic as prescribed.  You will need to return for any fever, chills or worsening redness

## 2021-06-01 DIAGNOSIS — Z419 Encounter for procedure for purposes other than remedying health state, unspecified: Secondary | ICD-10-CM | POA: Diagnosis not present

## 2021-06-03 ENCOUNTER — Ambulatory Visit: Payer: Medicaid Other | Admitting: Nurse Practitioner

## 2021-07-02 DIAGNOSIS — Z419 Encounter for procedure for purposes other than remedying health state, unspecified: Secondary | ICD-10-CM | POA: Diagnosis not present

## 2021-08-01 DIAGNOSIS — Z419 Encounter for procedure for purposes other than remedying health state, unspecified: Secondary | ICD-10-CM | POA: Diagnosis not present

## 2021-09-01 DIAGNOSIS — Z419 Encounter for procedure for purposes other than remedying health state, unspecified: Secondary | ICD-10-CM | POA: Diagnosis not present

## 2021-10-02 DIAGNOSIS — Z419 Encounter for procedure for purposes other than remedying health state, unspecified: Secondary | ICD-10-CM | POA: Diagnosis not present

## 2021-10-30 DIAGNOSIS — Z419 Encounter for procedure for purposes other than remedying health state, unspecified: Secondary | ICD-10-CM | POA: Diagnosis not present

## 2021-11-30 DIAGNOSIS — Z419 Encounter for procedure for purposes other than remedying health state, unspecified: Secondary | ICD-10-CM | POA: Diagnosis not present

## 2021-12-30 DIAGNOSIS — Z419 Encounter for procedure for purposes other than remedying health state, unspecified: Secondary | ICD-10-CM | POA: Diagnosis not present

## 2022-01-30 DIAGNOSIS — Z419 Encounter for procedure for purposes other than remedying health state, unspecified: Secondary | ICD-10-CM | POA: Diagnosis not present

## 2022-03-01 DIAGNOSIS — Z419 Encounter for procedure for purposes other than remedying health state, unspecified: Secondary | ICD-10-CM | POA: Diagnosis not present

## 2022-04-01 DIAGNOSIS — Z419 Encounter for procedure for purposes other than remedying health state, unspecified: Secondary | ICD-10-CM | POA: Diagnosis not present

## 2022-05-02 DIAGNOSIS — Z419 Encounter for procedure for purposes other than remedying health state, unspecified: Secondary | ICD-10-CM | POA: Diagnosis not present

## 2022-06-01 DIAGNOSIS — Z419 Encounter for procedure for purposes other than remedying health state, unspecified: Secondary | ICD-10-CM | POA: Diagnosis not present

## 2022-06-26 IMAGING — MR MR KNEE*L* W/O CM
7 series · 40 of 40 positions shown · non-contrast
Comparison: None.

CLINICAL DATA: Chronic left knee pain. Basketball injury 2 months
ago.

EXAM:
MRI OF THE LEFT KNEE WITHOUT CONTRAST
TECHNIQUE: Multiplanar, multisequence MR imaging of the knee was performed. No
intravenous contrast was administered.

[Series 6: T2 fat-sat · axial · left · 4.0mm · 0.50mm/px · z∈[-48,+105]mm · 6 of 36 slices shown (1 of 3)]
[im 1/36]
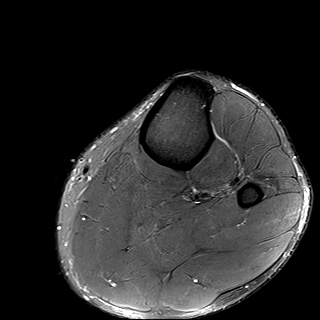
[im 8/36]
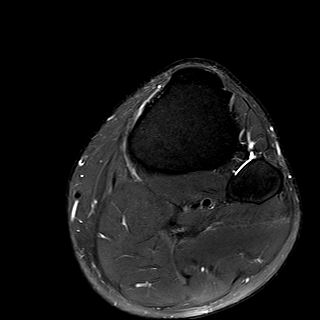
[im 15/36]
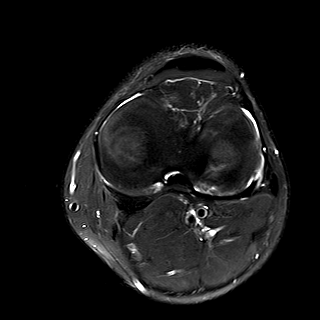
[im 22/36]
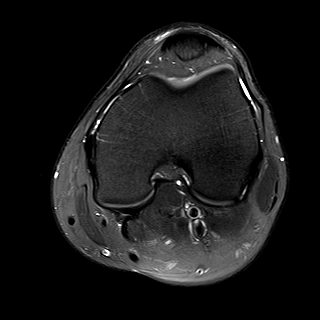
[im 29/36]
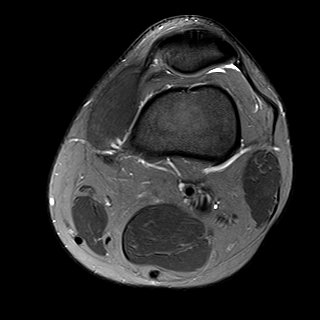
[im 36/36]
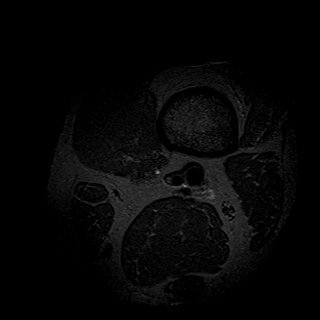

[Series 7: T2 fat-sat · coronal · left · 4.0mm · 0.47mm/px · 6 of 28 slices shown (2 of 3)]
[im 1/28]
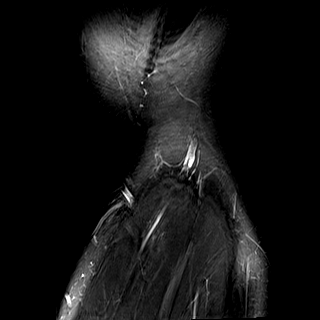
[im 6/28]
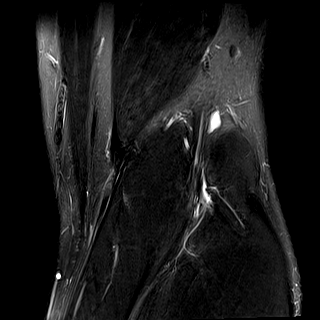
[im 11/28]
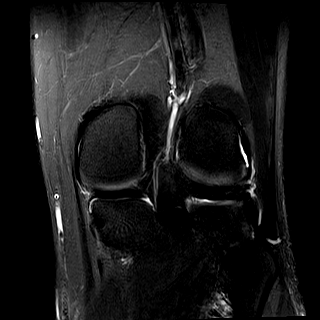
[im 17/28]
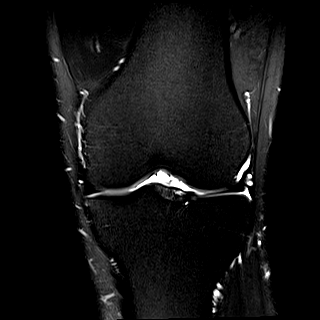
[im 22/28]
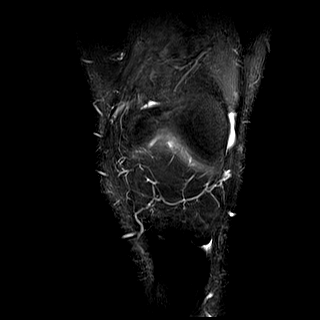
[im 28/28]
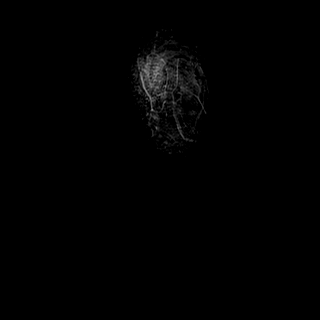

[Series 8: T1 · coronal · left · 4.0mm · 0.47mm/px · 6 of 28 slices shown]
[im 1/28]
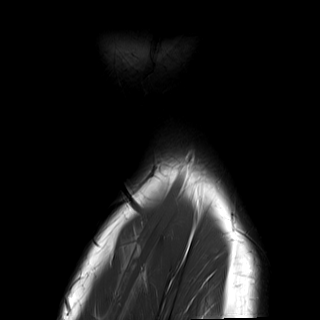
[im 6/28]
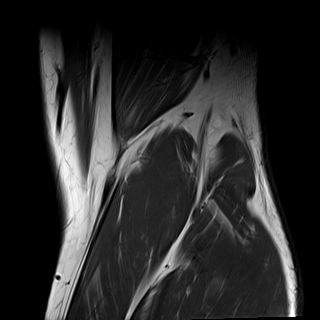
[im 11/28]
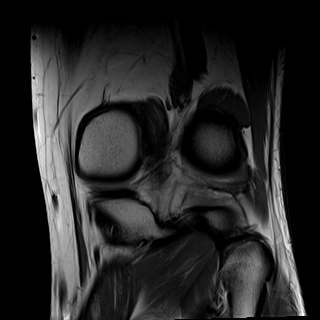
[im 17/28]
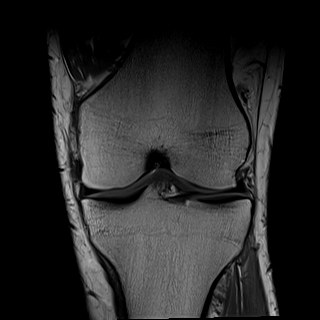
[im 22/28]
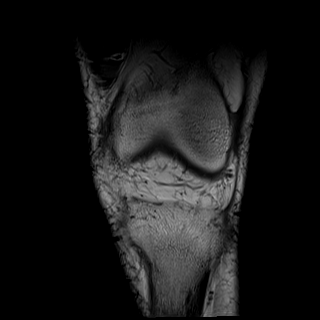
[im 28/28]
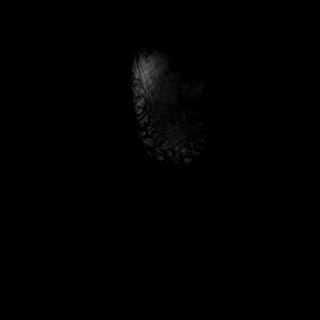

[Series 9: PD fat-sat · coronal · left · 3.0mm · 0.47mm/px · 6 of 28 slices shown (1 of 2)]
[im 1/28]
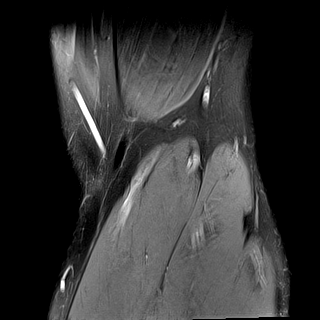
[im 6/28]
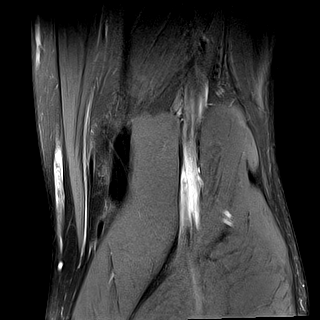
[im 11/28]
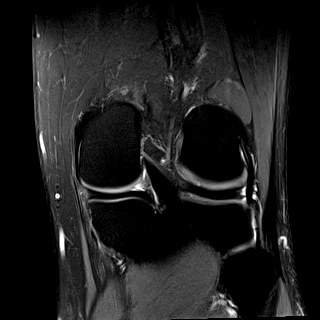
[im 17/28]
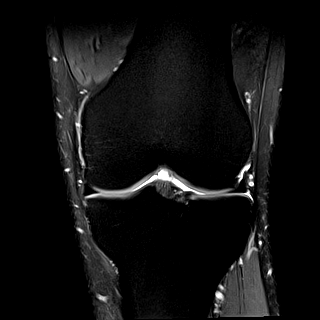
[im 22/28]
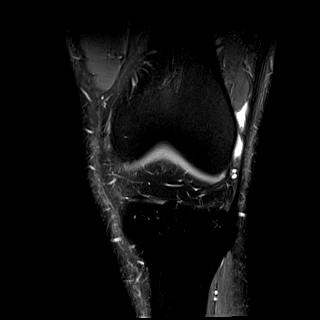
[im 28/28]
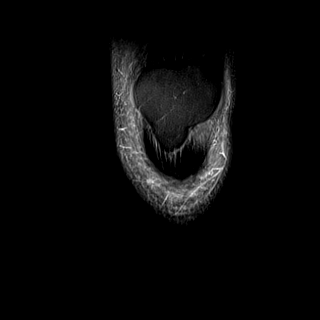

[Series 10: PD fat-sat · sagittal · left · 3.0mm · 0.47mm/px · 6 of 30 slices shown (2 of 2)]
[im 1/30]
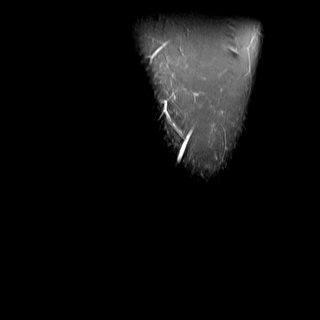
[im 6/30]
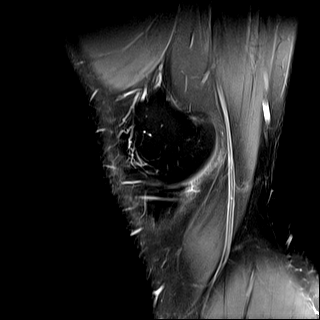
[im 12/30]
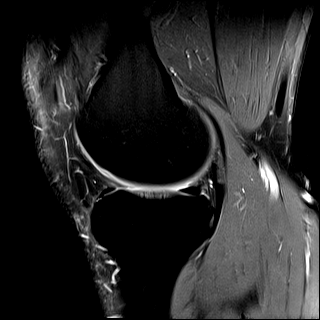
[im 18/30]
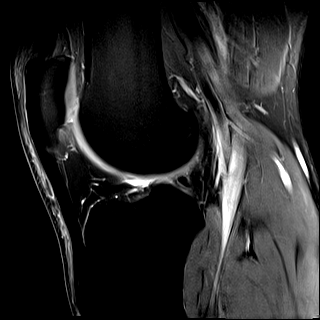
[im 24/30]
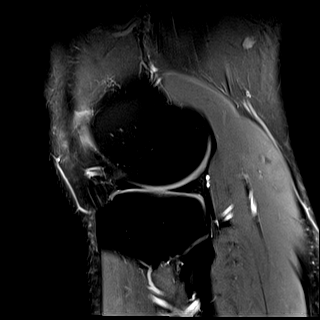
[im 30/30]
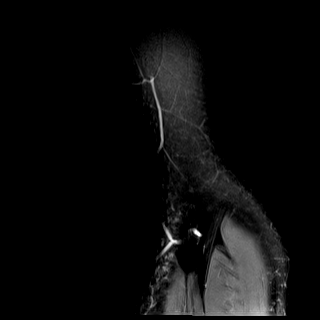

[Series 11: T2 fat-sat · sagittal · left · 3.0mm · 0.47mm/px · 6 of 30 slices shown (3 of 3)]
[im 1/30]
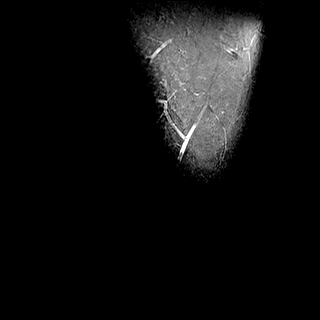
[im 6/30]
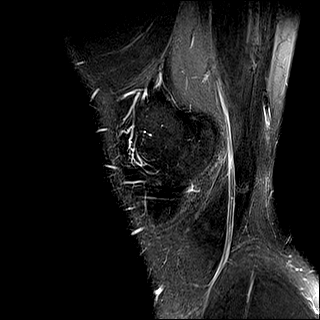
[im 12/30]
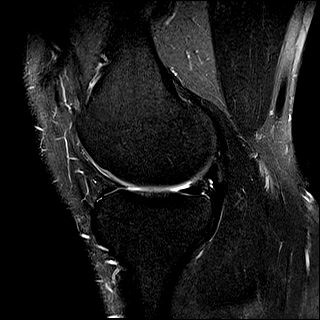
[im 18/30]
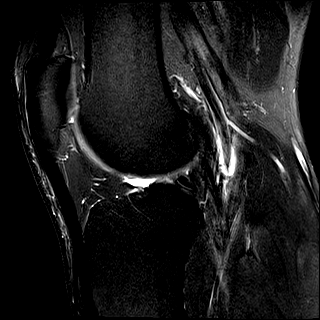
[im 24/30]
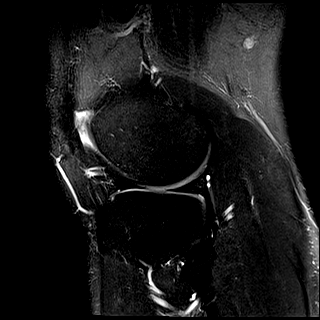
[im 30/30]
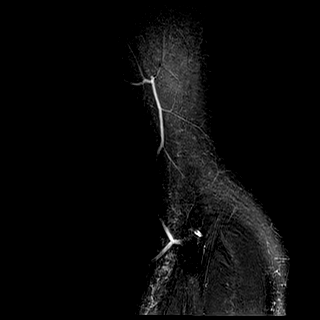

[Series 12: PD · oblique · left · 1.5mm · 0.44mm/px · 4 of 21 slices shown]
[im 1/21]
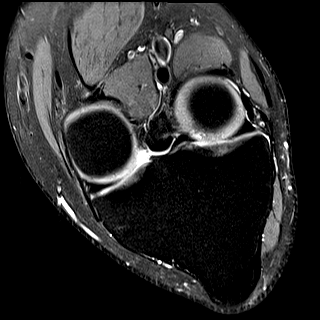
[im 7/21]
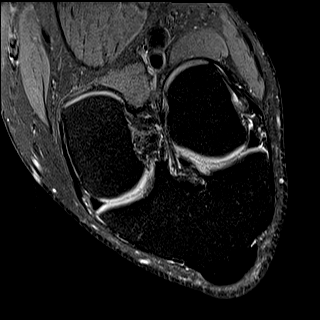
[im 14/21]
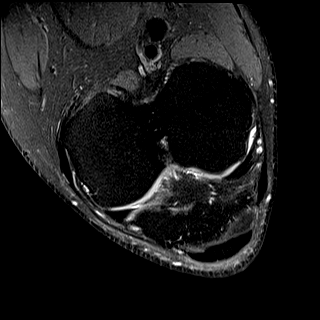
[im 21/21]
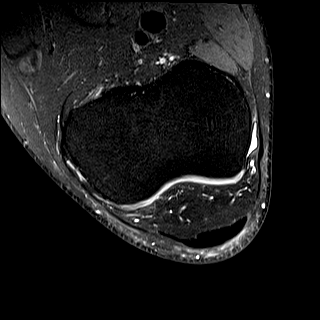

[40 of 40 positions shown; findings below may reference images not displayed]

FINDINGS: MENISCI

Medial: Intact.

Lateral: Intact.

LIGAMENTS

Cruciates: ACL and PCL are intact.

Collaterals: Medial collateral ligament is intact. Lateral
collateral ligament complex is intact.

CARTILAGE

Patellofemoral:  No chondral defect.

Medial:  No chondral defect.

Lateral: Focal full-thickness cartilage fissuring and subchondral
reactive marrow edema in the posterior weight-bearing surface of the
lateral femoral condyle.

JOINT: No joint effusion. Normal Pito Zack. No plical
thickening.

POPLITEAL FOSSA: Popliteus tendon is intact. No Baker's cyst.

EXTENSOR MECHANISM: Intact quadriceps tendon. Intact patellar
tendon. Intact lateral patellar retinaculum. Intact medial patellar
retinaculum. Intact MPFL.

BONES: No aggressive osseous lesion. No fracture or dislocation.

Other: No fluid collection or hematoma. Muscles are normal.
IMPRESSION: 1. Focal full-thickness cartilage fissuring and subchondral reactive
marrow edema in the posterior weight-bearing surface of the lateral
femoral condyle.
2. No meniscal or ligamentous injury of the left knee.

## 2022-07-02 DIAGNOSIS — Z419 Encounter for procedure for purposes other than remedying health state, unspecified: Secondary | ICD-10-CM | POA: Diagnosis not present

## 2022-08-01 DIAGNOSIS — Z419 Encounter for procedure for purposes other than remedying health state, unspecified: Secondary | ICD-10-CM | POA: Diagnosis not present

## 2022-12-30 ENCOUNTER — Ambulatory Visit
Admission: RE | Admit: 2022-12-30 | Discharge: 2022-12-30 | Disposition: A | Discharge status: Home / Self Care | Payer: Medicaid Other | Source: Ambulatory Visit | Attending: Nurse Practitioner | Admitting: Nurse Practitioner

## 2022-12-30 VITALS — BP 144/91 | HR 67 | Temp 98.0°F | Resp 16

## 2022-12-30 DIAGNOSIS — R21 Rash and other nonspecific skin eruption: Secondary | ICD-10-CM | POA: Diagnosis not present

## 2022-12-30 NOTE — ED Triage Notes (Signed)
Rash on foreskin. Pt is circumcised. Sexually active recently. Pt used latex condom but denies latex allergy. Pt noticed rash on Friday. Denies drainage or pain.

## 2022-12-30 NOTE — Discharge Instructions (Addendum)
Use hypoallergenic condoms  try using a thin layer of lotrimin to area of irritation twice a day

## 2023-01-01 NOTE — ED Provider Notes (Signed)
[facility]    CSN: [id number] Arrival date & time: 12/30/22  1504      History   Chief Complaint Chief Complaint  Patient presents with   Rash    HPI Dylan Herrera is a 32 y.o. male.   Patient complains of a rash on the foreskin of his penis.  Patient recently used latex condoms.  He does not have any history of latex allergy.  Patient denies any penile discharge he is not concerned about any type of STD  The history is provided by the patient. No language interpreter was used.  Rash Severity:  Mild Timing:  Constant   Past Medical History:  Diagnosis Date   Chicken pox    Seasonal allergies     Patient Active Problem List   Diagnosis Date Noted   HTN (hypertension) 07/26/2019   Plantar fasciitis 07/26/2019   BMI 33.0-33.9,adult 01/25/2015    Past Surgical History:  Procedure Laterality Date   ELBOW SURGERY  2008   foot ball injury, fx   LAPAROSCOPIC APPENDECTOMY N/A 09/20/2017   Procedure: APPENDECTOMY LAPAROSCOPIC;  Surgeon: Andria Meuse, MD;  Location: [facility] OR;  Service: General;  Laterality: N/A;       Home Medications    Prior to Admission medications   Medication Sig Start Date End Date Taking? Authorizing Provider  amLODipine (NORVASC) 5 MG tablet Take 1 tablet (5 mg total) by mouth daily. Patient not taking: Reported on 09/23/2019 08/29/19   Claiborne Rigg, NP  Blood Pressure Monitoring (BLOOD PRESSURE MONITOR 3) DEVI 1 Units by Does not apply route daily. Measure blood pressure daily 08/29/19   Claiborne Rigg, NP  meloxicam (MOBIC) 7.5 MG tablet Take 1 tablet (7.5 mg total) by mouth daily as needed (foot pain). 11/01/20   Grayce Sessions, NP    Family History Family History  Problem Relation Age of Onset   Thyroid disease Mother    Heart disease Neg Hx    Hyperlipidemia Neg Hx    Diabetes Neg Hx     Social History Social History   Tobacco Use   Smoking status: Never   Smokeless tobacco: Never  Vaping Use    Vaping Use: Some days  Substance Use Topics   Alcohol use: Yes    Comment: on occasions   Drug use: No     Allergies   Patient has no known allergies.   Review of Systems Review of Systems  Skin:  Positive for rash.  All other systems reviewed and are negative.    Physical Exam Triage Vital Signs ED Triage Vitals  Enc Vitals Group     BP 12/30/22 1519 (!) 144/91     Pulse Rate 12/30/22 1519 67     Resp 12/30/22 1519 16     Temp 12/30/22 1519 98 F (36.7 C)     Temp Source 12/30/22 1519 Oral     SpO2 12/30/22 1519 96 %     Weight --      Height --      Head Circumference --      Peak Flow --      Pain Score 12/30/22 1520 0     Pain Loc --      Pain Edu? --      Excl. in GC? --    No data found.  Updated Vital Signs BP (!) 144/91 (BP Location: Right Arm)   Pulse 67   Temp 98 F (36.7 C) (Oral)  Resp 16   SpO2 96%   Visual Acuity Right Eye Distance:   Left Eye Distance:   Bilateral Distance:    Right Eye Near:   Left Eye Near:    Bilateral Near:     Physical Exam Vitals and nursing note reviewed.  Constitutional:      Appearance: He is well-developed.  HENT:     Head: Normocephalic.  Cardiovascular:     Rate and Rhythm: Normal rate.  Pulmonary:     Effort: Pulmonary effort is normal.  Abdominal:     General: There is no distension.  Genitourinary:    Comments: Send has small red raised pimples above the glans of his penis, Musculoskeletal:        General: Normal range of motion.  Neurological:     General: No focal deficit present.     Mental Status: He is alert and oriented to person, place, and time.      UC Treatments / Results  Labs (all labs ordered are listed, but only abnormal results are displayed) Labs Reviewed - No data to display  EKG   Radiology No results found.  Procedures Procedures (including critical care time)  Medications Ordered in UC Medications - No data to display  Initial Impression / Assessment  and Plan / UC Course  I have reviewed the triage vital signs and the nursing notes.  Pertinent labs & imaging results that were available during my care of the patient were reviewed by me and considered in my medical decision making (see chart for details).     Rash appears to be contact although it possibly could be fungal infection I have advised the patient to use nonlatex condoms he is also advised to try Lotrimin cream he should recheck if symptoms do not resolve.  I reassured patient that this does not appear to be herpetic. Final Clinical Impressions(s) / UC Diagnoses   Final diagnoses:  Penile rash     Discharge Instructions      Use hypoallergenic condoms  try using a thin layer of lotrimin to area of irritation twice a day    ED Prescriptions   None    PDMP not reviewed this encounter. An After Visit Summary was printed and given to the patient.       Elson Areas, [place] 01/01/23 1256

## 2023-01-05 ENCOUNTER — Telehealth: Payer: Self-pay

## 2023-01-05 NOTE — Telephone Encounter (Signed)
Sending mychart msg. AS, CMA 

## 2023-01-12 ENCOUNTER — Encounter: Payer: Self-pay | Admitting: Internal Medicine

## 2023-01-12 ENCOUNTER — Other Ambulatory Visit (INDEPENDENT_AMBULATORY_CARE_PROVIDER_SITE_OTHER): Payer: Medicaid Other

## 2023-01-12 ENCOUNTER — Ambulatory Visit (INDEPENDENT_AMBULATORY_CARE_PROVIDER_SITE_OTHER): Payer: Medicaid Other | Admitting: Internal Medicine

## 2023-01-12 ENCOUNTER — Other Ambulatory Visit (HOSPITAL_COMMUNITY)
Admission: RE | Admit: 2023-01-12 | Discharge: 2023-01-12 | Disposition: A | Discharge status: Home / Self Care | Payer: Medicaid Other | Source: Ambulatory Visit | Attending: Internal Medicine | Admitting: Internal Medicine

## 2023-01-12 VITALS — BP 163/108 | HR 55 | Temp 98.3°F | Ht 72.0 in | Wt 227.5 lb

## 2023-01-12 DIAGNOSIS — Z113 Encounter for screening for infections with a predominantly sexual mode of transmission: Secondary | ICD-10-CM

## 2023-01-12 DIAGNOSIS — B3749 Other urogenital candidiasis: Secondary | ICD-10-CM

## 2023-01-12 DIAGNOSIS — I10 Essential (primary) hypertension: Secondary | ICD-10-CM | POA: Diagnosis not present

## 2023-01-12 LAB — CBC WITH DIFFERENTIAL/PLATELET
Basophils Absolute: 0 10*3/uL (ref 0.0–0.1)
Basophils Relative: 1 % (ref 0.0–3.0)
Eosinophils Absolute: 0.1 10*3/uL (ref 0.0–0.7)
Eosinophils Relative: 2.2 % (ref 0.0–5.0)
HCT: 42.7 % (ref 39.0–52.0)
Hemoglobin: 14.1 g/dL (ref 13.0–17.0)
Lymphocytes Relative: 30.3 % (ref 12.0–46.0)
Lymphs Abs: 1.1 10*3/uL (ref 0.7–4.0)
MCHC: 33 g/dL (ref 30.0–36.0)
MCV: 81.6 fl (ref 78.0–100.0)
Monocytes Absolute: 0.4 10*3/uL (ref 0.1–1.0)
Monocytes Relative: 9.4 % (ref 3.0–12.0)
Neutro Abs: 2.1 10*3/uL (ref 1.4–7.7)
Neutrophils Relative %: 57.1 % (ref 43.0–77.0)
Platelets: 253 10*3/uL (ref 150.0–400.0)
RBC: 5.23 Mil/uL (ref 4.22–5.81)
RDW: 13.7 % (ref 11.5–15.5)
WBC: 3.8 10*3/uL — ABNORMAL LOW (ref 4.0–10.5)

## 2023-01-12 LAB — LIPID PANEL
Cholesterol: 182 mg/dL (ref 0–200)
HDL: 34.5 mg/dL — ABNORMAL LOW (ref >39.00)
LDL Cholesterol: 119 mg/dL — ABNORMAL HIGH (ref 0–99)
NonHDL: 147.74
Total CHOL/HDL Ratio: 5
Triglycerides: 146 mg/dL (ref 0.0–149.0)
VLDL: 29.2 mg/dL (ref 0.0–40.0)

## 2023-01-12 LAB — COMPREHENSIVE METABOLIC PANEL
ALT: 30 U/L (ref 0–53)
AST: 20 U/L (ref 0–37)
Albumin: 4.5 g/dL (ref 3.5–5.2)
Alkaline Phosphatase: 33 U/L — ABNORMAL LOW (ref 39–117)
BUN: 8 mg/dL (ref 6–23)
CO2: 30 mEq/L (ref 19–32)
Calcium: 9.4 mg/dL (ref 8.4–10.5)
Chloride: 103 mEq/L (ref 96–112)
Creatinine, Ser: 1.08 mg/dL (ref 0.40–1.50)
GFR: 90.94 mL/min (ref >60.00)
Glucose, Bld: 84 mg/dL (ref 70–99)
Potassium: 4 mEq/L (ref 3.5–5.1)
Sodium: 138 mEq/L (ref 135–145)
Total Bilirubin: 0.7 mg/dL (ref 0.2–1.2)
Total Protein: 7.2 g/dL (ref 6.0–8.3)

## 2023-01-12 LAB — HEMOGLOBIN A1C: Hgb A1c MFr Bld: 5.8 % (ref 4.6–6.5)

## 2023-01-12 MED ORDER — AMLODIPINE BESYLATE 5 MG PO TABS: 5.0000 mg | ORAL_TABLET | Freq: Every day | ORAL | 1 refills | Status: AC

## 2023-01-12 MED ORDER — FLUCONAZOLE 150 MG PO TABS: 150.0000 mg | ORAL_TABLET | Freq: Every day | ORAL | 0 refills | Status: AC

## 2023-01-12 NOTE — Assessment & Plan Note (Signed)
New diagnosis. Uncontrolled. Start amlodipine 5 mg daily. Discussed lifestyle changes including low sodium diet. He will do ambulatory BP measurements in return in 6-8 weeks for follow up.

## 2023-01-12 NOTE — Progress Notes (Signed)
Established Patient Office Visit     CC/Reason for Visit: Establish care,  HPI: Dylan Herrera is a 32 y.o. male who is coming in today for the above mentioned reasons.  He has no past medical history of significance.  For a few weeks he has noticed a rash on his penis.  It is not itchy but it is "uncomfortable".  No penile discharge.  He is in a monogamous relationship with his wife.  He has 1 daughter and his wife is currently pregnant.  His blood pressure is noted to be elevated today.  He has been told he has hypertension in the past.  He quit smoking years ago, he drinks alcohol only occasionally, he has no allergies, his past surgical history significant for an appendectomy in 2019.   Past Medical/Surgical History: Past Medical History:  Diagnosis Date   Chicken pox    Hypertension    Seasonal allergies     Past Surgical History:  Procedure Laterality Date   ELBOW SURGERY  2008   foot ball injury, fx   LAPAROSCOPIC APPENDECTOMY N/A 09/20/2017   Procedure: APPENDECTOMY LAPAROSCOPIC;  Surgeon: Andria Meuse, MD;  Location: [facility] OR;  Service: General;  Laterality: N/A;    Social History:  reports that he has never smoked. He has never used smokeless tobacco. He reports current alcohol use. He reports that he does not use drugs.  Allergies: No Known Allergies  Family History:  Family History  Problem Relation Age of Onset   Thyroid disease Mother    Heart disease Neg Hx    Hyperlipidemia Neg Hx    Diabetes Neg Hx      Current Outpatient Medications:    amLODipine (NORVASC) 5 MG tablet, Take 1 tablet (5 mg total) by mouth daily., Disp: 90 tablet, Rfl: 1   fluconazole (DIFLUCAN) 150 MG tablet, Take 1 tablet (150 mg total) by mouth daily for 3 days., Disp: 3 tablet, Rfl: 0  Review of Systems:  Negative unless indicated in HPI.   Physical Exam: Vitals:   01/12/23 1530 01/12/23 1534  BP: (!) 150/100 (!) 163/108  Pulse: (!) 55   Temp: 98.3 F (36.8  C)   TempSrc: Oral   SpO2: 99%   Weight: 227 lb 8 oz (103.2 kg)   Height: 6' (1.829 m)     Body mass index is 30.85 kg/m.   Physical Exam Vitals reviewed.  Constitutional:      Appearance: Normal appearance.  HENT:     Head: Normocephalic and atraumatic.  Eyes:     Conjunctiva/sclera: Conjunctivae normal.     Pupils: Pupils are equal, round, and reactive to light.  Cardiovascular:     Heart sounds: Murmur heard.  Pulmonary:     Effort: Pulmonary effort is normal.     Breath sounds: Normal breath sounds.  Genitourinary:    Penis: Circumcised.        Comments: Hypopigmented areas surrounding the base of the penile tip. Skin:    General: Skin is warm and dry.  Neurological:     General: No focal deficit present.     Mental Status: He is alert and oriented to person, place, and time.  Psychiatric:        Mood and Affect: Mood normal.        Behavior: Behavior normal.        Thought Content: Thought content normal.        Judgment: Judgment normal.  Impression and Plan:  Primary hypertension Assessment & Plan: New diagnosis. Uncontrolled. Start amlodipine 5 mg daily. Discussed lifestyle changes including low sodium diet. He will do ambulatory BP measurements in return in 6-8 weeks for follow up.  Orders: -     CBC with Differential/Platelet; Future -     Comprehensive metabolic panel; Future -     Hemoglobin A1c; Future -     Lipid panel; Future -     amLODIPine Besylate; Take 1 tablet (5 mg total) by mouth daily.  Dispense: 90 tablet; Refill: 1  Screen for STD (sexually transmitted disease) -     Hepatitis panel, acute; Future -     HIV Antibody (routine testing w rflx); Future -     RPR; Future -     Urine cytology ancillary only  Yeast dermatitis of penis -     Fluconazole; Take 1 tablet (150 mg total) by mouth daily for 3 days.  Dispense: 3 tablet; Refill: 0  -Suspect yeast dermatitis. Fluconazole 150mg  daily for 3 days. Screen for STDs.   Time  spent:46 minutes spent during today's visit reviewing chart, interviewing and examining patient and formulating plan of care. We have discussed safe sex practices, treatment of yeast dermatitis and lifestyle modifications as it related to treatment of hypertension.     Chaya Jan, MD [facility]

## 2023-01-12 NOTE — Patient Instructions (Signed)
-  Nice seeing you today!!  -Lab work today; will notify you once results are available.  -Start amlodipine 5 mg daily for your blood pressure.  -Schedule follow up in 6 weeks.  -Start fluconazole 150 mg daily for 3 days.

## 2023-01-13 LAB — HEPATITIS PANEL, ACUTE
Hep A IgM: NONREACTIVE
Hep B C IgM: NONREACTIVE
Hepatitis B Surface Ag: NONREACTIVE
Hepatitis C Ab: NONREACTIVE

## 2023-01-13 LAB — HIV ANTIBODY (ROUTINE TESTING W REFLEX): HIV 1&2 Ab, 4th Generation: NONREACTIVE

## 2023-01-13 LAB — RPR: RPR Ser Ql: NONREACTIVE

## 2023-01-14 LAB — URINE CYTOLOGY ANCILLARY ONLY
Chlamydia: NEGATIVE
Comment: NEGATIVE
Comment: NEGATIVE
Comment: NORMAL
Neisseria Gonorrhea: NEGATIVE
Trichomonas: NEGATIVE

## 2023-02-23 ENCOUNTER — Ambulatory Visit (INDEPENDENT_AMBULATORY_CARE_PROVIDER_SITE_OTHER): Payer: Medicaid Other | Admitting: Internal Medicine

## 2023-02-23 VITALS — BP 165/121 | HR 64 | Temp 98.0°F | Wt 218.0 lb

## 2023-02-23 DIAGNOSIS — I10 Essential (primary) hypertension: Secondary | ICD-10-CM | POA: Diagnosis not present

## 2023-02-23 NOTE — Progress Notes (Signed)
     Established Patient Office Visit     CC/Reason for Visit: Blood pressure follow-up  HPI: Dylan Herrera is a 32 y.o. male who is coming in today for the above mentioned reasons. Past Medical History is significant for: Hypertension that was diagnosed at visit 6 weeks ago.  Unfortunately he has not been compliant with amlodipine as he has been traveling.   Past Medical/Surgical History: Past Medical History:  Diagnosis Date   Chicken pox    Hypertension    Seasonal allergies     Past Surgical History:  Procedure Laterality Date   ELBOW SURGERY  2008   foot ball injury, fx   LAPAROSCOPIC APPENDECTOMY N/A 09/20/2017   Procedure: APPENDECTOMY LAPAROSCOPIC;  Surgeon: Andria Meuse, MD;  Location: [facility] OR;  Service: General;  Laterality: N/A;    Social History:  reports that he has never smoked. He has never used smokeless tobacco. He reports current alcohol use. He reports that he does not use drugs.  Allergies: No Known Allergies  Family History:  Family History  Problem Relation Age of Onset   Thyroid disease Mother    Heart disease Neg Hx    Hyperlipidemia Neg Hx    Diabetes Neg Hx      Current Outpatient Medications:    amLODipine (NORVASC) 5 MG tablet, Take 1 tablet (5 mg total) by mouth daily., Disp: 90 tablet, Rfl: 1  Review of Systems:  Negative unless indicated in HPI.   Physical Exam: Vitals:   02/23/23 1527 02/23/23 1530  BP: (!) 160/101 (!) 165/121  Pulse: 64   Temp: 98 F (36.7 C)   TempSrc: Oral   SpO2: 98%   Weight: 218 lb (98.9 kg)     Body mass index is 29.57 kg/m.   Physical Exam Vitals reviewed.  Constitutional:      Appearance: Normal appearance.  HENT:     Head: Normocephalic and atraumatic.  Eyes:     Conjunctiva/sclera: Conjunctivae normal.     Pupils: Pupils are equal, round, and reactive to light.  Cardiovascular:     Rate and Rhythm: Normal rate and regular rhythm.  Pulmonary:     Effort: Pulmonary  effort is normal.     Breath sounds: Normal breath sounds.  Skin:    General: Skin is warm and dry.  Neurological:     General: No focal deficit present.     Mental Status: He is alert and oriented to person, place, and time.  Psychiatric:        Mood and Affect: Mood normal.        Behavior: Behavior normal.        Thought Content: Thought content normal.        Judgment: Judgment normal.      Impression and Plan:  Primary hypertension  -Unsurprisingly not controlled he has not been compliant with amlodipine.  Discussed importance of adherence to medical treatment, he will return in 6 to 8 weeks for follow-up.   Time spent:22 minutes reviewing chart, interviewing and examining patient and formulating plan of care.     Chaya Jan, MD [facility]

## 2023-04-01 ENCOUNTER — Encounter (INDEPENDENT_AMBULATORY_CARE_PROVIDER_SITE_OTHER): Payer: Self-pay

## 2023-04-08 ENCOUNTER — Ambulatory Visit: Payer: Medicaid Other | Admitting: Internal Medicine

## 2023-04-16 ENCOUNTER — Encounter: Payer: Self-pay | Admitting: Family Medicine

## 2023-04-16 ENCOUNTER — Ambulatory Visit (INDEPENDENT_AMBULATORY_CARE_PROVIDER_SITE_OTHER): Payer: Medicaid Other | Admitting: Family Medicine

## 2023-04-16 VITALS — BP 120/70 | HR 74 | Temp 98.3°F | Wt 217.2 lb

## 2023-04-16 DIAGNOSIS — R21 Rash and other nonspecific skin eruption: Secondary | ICD-10-CM

## 2023-04-16 MED ORDER — TRIAMCINOLONE ACETONIDE 0.025 % EX OINT: 1.0000 | TOPICAL_OINTMENT | Freq: Two times a day (BID) | CUTANEOUS | 0 refills | Status: AC

## 2023-04-16 NOTE — Progress Notes (Signed)
Established Patient Office Visit   Subjective  Patient ID: Dylan Herrera, male    DOB: 1990-11-23  Age: 32 y.o. MRN: [id number]  Chief Complaint  Patient presents with   Rash    Started a month ago, bilateral hands feet and arms, itchy     Patient is a 32 year old male followed by Dr. Ardyth Harps and seen for ongoing concern.  Patient with a mildly pruritic rash on bilateral hands x 1 month.  Patient states rash noted on dorsum of left hand been seen to spread to right hand and ankles.  Patient does not have rash in palm of hands.  Initially thought rash was due to heat/sun exposure.  Tried using aloe vera.  Patient also notes having a cut on left hand that seem to bubble up/not heal correctly.  Patient denies history of keloids.  Patient inquires if rash is related to recent rash on head of penis.  Patient states he was treated with a medication for yeast but is unsure if it helped symptoms.  No recent sexual activity as pt's wife gave birth to their son on July 4.  Denies fever, chills, URI symptoms, changes in soaps, lotions, detergents, pets, sick contacts.  Rash    Past Medical History:  Diagnosis Date   Chicken pox    Hypertension    Seasonal allergies    Past Surgical History:  Procedure Laterality Date   ELBOW SURGERY  2008   foot ball injury, fx   LAPAROSCOPIC APPENDECTOMY N/A 09/20/2017   Procedure: APPENDECTOMY LAPAROSCOPIC;  Surgeon: Andria Meuse, MD;  Location: [facility] OR;  Service: General;  Laterality: N/A;   Social History   Tobacco Use   Smoking status: Never   Smokeless tobacco: Never  Vaping Use   Vaping status: Some Days  Substance Use Topics   Alcohol use: Yes    Comment: on occasions   Drug use: No   Family History  Problem Relation Age of Onset   Thyroid disease Mother    Heart disease Neg Hx    Hyperlipidemia Neg Hx    Diabetes Neg Hx    No Known Allergies    Review of Systems  Skin:  Positive for rash.   Negative unless stated  above    Objective:     BP 120/70 (BP Location: Left Arm, Patient Position: Sitting, Cuff Size: Normal)   Pulse 74   Temp 98.3 F (36.8 C) (Oral)   Wt 217 lb 3.2 oz (98.5 kg)   SpO2 97%   BMI 29.46 kg/m    Physical Exam Constitutional:      Appearance: Normal appearance.  HENT:     Head: Normocephalic and atraumatic.     Nose: Nose normal.     Mouth/Throat:     Mouth: Mucous membranes are moist.  Eyes:     Extraocular Movements: Extraocular movements intact.     Conjunctiva/sclera: Conjunctivae normal.  Cardiovascular:     Rate and Rhythm: Normal rate.  Pulmonary:     Effort: Pulmonary effort is normal.  Skin:    General: Skin is warm and dry.     Findings: Rash present.     Comments: Flesh colored papules 1-2 mm on dorsum of bilateral hands, left anterior wrist.  Slightly hyperpigmented papules, several with central areas of eschar, on medial left ankle and dorsum of left foot.  Hypertrophic scar on dorsum of left hand.  No pustules, erythema, drainage noted.  Neurological:     Mental Status: He  is alert and oriented to person, place, and time.             No results found for any visits on 04/16/23.    Assessment & Plan:  Papular rash -     Triamcinolone Acetonide; Apply 1 Application topically 2 (two) times daily.  Dispense: 30 g; Refill: 0  Discussed possible causes of papular rash with milia like appearance including viral etiology such as warts, light eruption, etc. Hand-foot-and-mouth less likely given appearance.  Also consider contact dermatitis as patient wears gloves to work.  Start triamcinolone cream.  OTC antihistamine for pruritus if needed.  For continued or worsening symptoms consider dermatology referral  Return if symptoms worsen or fail to improve.   Deeann Saint, MD

## 2023-04-23 ENCOUNTER — Ambulatory Visit: Payer: Medicaid Other | Admitting: Internal Medicine

## 2023-04-30 ENCOUNTER — Ambulatory Visit: Payer: Medicaid Other | Admitting: Internal Medicine

## 2023-05-05 ENCOUNTER — Ambulatory Visit: Payer: Medicaid Other | Admitting: Internal Medicine

## 2023-05-07 ENCOUNTER — Ambulatory Visit (INDEPENDENT_AMBULATORY_CARE_PROVIDER_SITE_OTHER): Payer: Medicaid Other | Admitting: Internal Medicine

## 2023-05-07 ENCOUNTER — Encounter: Payer: Self-pay | Admitting: Internal Medicine

## 2023-05-07 VITALS — BP 143/92 | Temp 98.1°F | Wt 220.7 lb

## 2023-05-07 DIAGNOSIS — I10 Essential (primary) hypertension: Secondary | ICD-10-CM

## 2023-05-07 MED ORDER — VALSARTAN-HYDROCHLOROTHIAZIDE 160-25 MG PO TABS: 1.0000 | ORAL_TABLET | Freq: Every day | ORAL | 1 refills | Status: AC

## 2023-05-07 NOTE — Progress Notes (Signed)
     Established Patient Office Visit     CC/Reason for Visit: Follow-up blood pressure  HPI: Dylan Herrera is a 32 y.o. male who is coming in today for the above mentioned reasons. Past Medical History is significant for: Hypertension.  He has been adherent with amlodipine, here today for follow-up blood pressure, has not been checking blood pressure at home routinely.  Feels well without acute concerns or complaints.   Past Medical/Surgical History: Past Medical History:  Diagnosis Date   Chicken pox    Hypertension    Seasonal allergies     Past Surgical History:  Procedure Laterality Date   ELBOW SURGERY  2008   foot ball injury, fx   LAPAROSCOPIC APPENDECTOMY N/A 09/20/2017   Procedure: APPENDECTOMY LAPAROSCOPIC;  Surgeon: Andria Meuse, MD;  Location: [facility] OR;  Service: General;  Laterality: N/A;    Social History:  reports that he has never smoked. He has never used smokeless tobacco. He reports current alcohol use. He reports that he does not use drugs.  Allergies: No Known Allergies  Family History:  Family History  Problem Relation Age of Onset   Thyroid disease Mother    Heart disease Neg Hx    Hyperlipidemia Neg Hx    Diabetes Neg Hx      Current Outpatient Medications:    amLODipine (NORVASC) 5 MG tablet, Take 1 tablet (5 mg total) by mouth daily., Disp: 90 tablet, Rfl: 1   triamcinolone (KENALOG) 0.025 % ointment, Apply 1 Application topically 2 (two) times daily., Disp: 30 g, Rfl: 0   valsartan-hydrochlorothiazide (DIOVAN-HCT) 160-25 MG tablet, Take 1 tablet by mouth daily., Disp: 90 tablet, Rfl: 1  Review of Systems:  Negative unless indicated in HPI.   Physical Exam: Vitals:   05/07/23 1409 05/07/23 1413  BP:  (!) 143/92  Temp: 98.1 F (36.7 C)   TempSrc: Oral   Weight: 220 lb 11.2 oz (100.1 kg)     Body mass index is 29.93 kg/m.   Physical Exam Vitals reviewed.  Constitutional:      Appearance: Normal appearance.   HENT:     Head: Normocephalic and atraumatic.  Eyes:     Conjunctiva/sclera: Conjunctivae normal.     Pupils: Pupils are equal, round, and reactive to light.  Cardiovascular:     Rate and Rhythm: Normal rate and regular rhythm.  Pulmonary:     Effort: Pulmonary effort is normal.     Breath sounds: Normal breath sounds.  Skin:    General: Skin is warm and dry.  Neurological:     General: No focal deficit present.     Mental Status: He is alert and oriented to person, place, and time.  Psychiatric:        Mood and Affect: Mood normal.        Behavior: Behavior normal.        Thought Content: Thought content normal.        Judgment: Judgment normal.      Impression and Plan:  Primary hypertension -     Valsartan-hydroCHLOROthiazide; Take 1 tablet by mouth daily.  Dispense: 90 tablet; Refill: 1  -Not well-controlled.  Continue amlodipine 5 mg, add Diovan HCT 160/25 mg and return in 8 weeks for follow-up.  Consider checking renal function and electrolytes at follow-up visit.   Time spent:30 minutes reviewing chart, interviewing and examining patient and formulating plan of care.     Chaya Jan, MD [facility] at [facility]

## 2023-06-01 ENCOUNTER — Telehealth: Payer: Medicaid Other | Admitting: Internal Medicine

## 2023-06-09 ENCOUNTER — Telehealth: Payer: Medicaid Other | Admitting: Internal Medicine

## 2023-06-09 DIAGNOSIS — I10 Essential (primary) hypertension: Secondary | ICD-10-CM

## 2023-06-09 NOTE — Progress Notes (Signed)
"  Patient was unable to self-report due to a lack of equipment at home via telehealth"

## 2023-06-09 NOTE — Progress Notes (Signed)
No show to virtual appointment. Left VM asking to reschedule.  Peggye Pitt, MD [facility] at [facility]

## 2023-06-18 ENCOUNTER — Ambulatory Visit: Payer: Medicaid Other | Admitting: Internal Medicine

## 2023-07-14 ENCOUNTER — Ambulatory Visit (INDEPENDENT_AMBULATORY_CARE_PROVIDER_SITE_OTHER): Payer: Medicaid Other | Admitting: Internal Medicine

## 2023-07-14 VITALS — BP 138/91 | HR 53 | Temp 98.3°F | Wt 225.2 lb

## 2023-07-14 DIAGNOSIS — I10 Essential (primary) hypertension: Secondary | ICD-10-CM | POA: Diagnosis not present

## 2023-07-14 DIAGNOSIS — R21 Rash and other nonspecific skin eruption: Secondary | ICD-10-CM

## 2023-07-14 NOTE — Progress Notes (Signed)
     Established Patient Office Visit     CC/Reason for Visit: Follow-up rash  HPI: Dylan Herrera is a 32 y.o. male who is coming in today for the above mentioned reasons.  He was seen in August for the same.  It is a faint papular rash over the dorsum of his hands, forearms and top of his feet.  He was prescribed triamcinolone cream that helped a little with itching but rash is still present.   Past Medical/Surgical History: Past Medical History:  Diagnosis Date   Chicken pox    Hypertension    Seasonal allergies     Past Surgical History:  Procedure Laterality Date   ELBOW SURGERY  2008   foot ball injury, fx   LAPAROSCOPIC APPENDECTOMY N/A 09/20/2017   Procedure: APPENDECTOMY LAPAROSCOPIC;  Surgeon: Andria Meuse, MD;  Location: [facility] OR;  Service: General;  Laterality: N/A;    Social History:  reports that he has never smoked. He has never used smokeless tobacco. He reports current alcohol use. He reports that he does not use drugs.  Allergies: No Known Allergies  Family History:  Family History  Problem Relation Age of Onset   Thyroid disease Mother    Heart disease Neg Hx    Hyperlipidemia Neg Hx    Diabetes Neg Hx      Current Outpatient Medications:    amLODipine (NORVASC) 5 MG tablet, Take 1 tablet (5 mg total) by mouth daily., Disp: 90 tablet, Rfl: 1   triamcinolone (KENALOG) 0.025 % ointment, Apply 1 Application topically 2 (two) times daily., Disp: 30 g, Rfl: 0   valsartan-hydrochlorothiazide (DIOVAN-HCT) 160-25 MG tablet, Take 1 tablet by mouth daily., Disp: 90 tablet, Rfl: 1  Review of Systems:  Negative unless indicated in HPI.   Physical Exam: Vitals:   07/14/23 1413 07/14/23 1417  BP: (!) 140/96 (!) 138/91  Pulse: (!) 53   Temp: 98.3 F (36.8 C)   TempSrc: Oral   SpO2: 100%   Weight: 225 lb 3.2 oz (102.2 kg)     Body mass index is 30.54 kg/m.   Physical Exam Skin:    Comments: Faint, papular rash over dorsum of hands,  for illness and top of feet.  On      Impression and Plan:  Papular rash -     Ambulatory referral to Dermatology  Primary hypertension   -Unclear etiology of rash, no improvement with triamcinolone.  Patient requests referral to dermatology. -Blood pressure remains uncontrolled, he is not taking valsartan.  Have discussed importance of adherence.  Time spent:21 minutes reviewing chart, interviewing and examining patient and formulating plan of care.     Chaya Jan, MD [facility] at [facility]

## 2023-07-28 ENCOUNTER — Encounter: Payer: Self-pay | Admitting: Internal Medicine

## 2024-01-20 ENCOUNTER — Ambulatory Visit: Admitting: Internal Medicine
